# Patient Record
Sex: Female | Born: 1975 | Race: Black or African American | Hispanic: No | Marital: Single | Smoking: Never smoker
Health system: Southern US, Community
[De-identification: ages and names within clinical notes are randomized; demographics above are authoritative.]

## PROBLEM LIST (undated history)

## (undated) DIAGNOSIS — E049 Nontoxic goiter, unspecified: Secondary | ICD-10-CM

## (undated) DIAGNOSIS — R748 Abnormal levels of other serum enzymes: Secondary | ICD-10-CM

## (undated) DIAGNOSIS — M797 Fibromyalgia: Secondary | ICD-10-CM

## (undated) DIAGNOSIS — J0100 Acute maxillary sinusitis, unspecified: Secondary | ICD-10-CM

## (undated) DIAGNOSIS — D252 Subserosal leiomyoma of uterus: Secondary | ICD-10-CM

## (undated) DIAGNOSIS — K611 Rectal abscess: Secondary | ICD-10-CM

## (undated) DIAGNOSIS — K8020 Calculus of gallbladder without cholecystitis without obstruction: Secondary | ICD-10-CM

## (undated) DIAGNOSIS — M1990 Unspecified osteoarthritis, unspecified site: Secondary | ICD-10-CM

## (undated) DIAGNOSIS — Z862 Personal history of diseases of the blood and blood-forming organs and certain disorders involving the immune mechanism: Secondary | ICD-10-CM

## (undated) HISTORY — DX: Fibromyalgia: M79.7

## (undated) HISTORY — DX: Nontoxic goiter, unspecified: E04.9

## (undated) HISTORY — DX: Subserosal leiomyoma of uterus: D25.2

## (undated) HISTORY — PX: ECTOPIC PREGNANCY SURGERY: SHX613

## (undated) HISTORY — DX: Rectal abscess: K61.1

## (undated) HISTORY — PX: ABDOMINAL HYSTERECTOMY: SHX81

## (undated) HISTORY — DX: Personal history of diseases of the blood and blood-forming organs and certain disorders involving the immune mechanism: Z86.2

## (undated) HISTORY — DX: Abnormal levels of other serum enzymes: R74.8

## (undated) HISTORY — DX: Acute maxillary sinusitis, unspecified: J01.00

## (undated) HISTORY — DX: Unspecified osteoarthritis, unspecified site: M19.90

---

## 2002-05-06 ENCOUNTER — Inpatient Hospital Stay (HOSPITAL_COMMUNITY): Admission: AD | Admit: 2002-05-06 | Discharge: 2002-05-07 | Payer: Self-pay | Admitting: Obstetrics and Gynecology

## 2002-05-07 ENCOUNTER — Encounter (INDEPENDENT_AMBULATORY_CARE_PROVIDER_SITE_OTHER): Payer: Self-pay

## 2002-11-04 ENCOUNTER — Encounter: Admission: RE | Admit: 2002-11-04 | Discharge: 2002-11-04 | Payer: Self-pay | Admitting: Family Medicine

## 2002-11-06 ENCOUNTER — Encounter: Payer: Self-pay | Admitting: Sports Medicine

## 2002-11-06 ENCOUNTER — Encounter: Admission: RE | Admit: 2002-11-06 | Discharge: 2002-11-06 | Payer: Self-pay | Admitting: Sports Medicine

## 2002-11-16 ENCOUNTER — Encounter: Admission: RE | Admit: 2002-11-16 | Discharge: 2002-11-16 | Payer: Self-pay | Admitting: Sports Medicine

## 2004-01-27 ENCOUNTER — Ambulatory Visit: Payer: Self-pay | Admitting: Family Medicine

## 2004-02-16 ENCOUNTER — Ambulatory Visit: Payer: Self-pay | Admitting: Family Medicine

## 2004-06-23 ENCOUNTER — Ambulatory Visit: Payer: Self-pay | Admitting: Family Medicine

## 2004-06-27 ENCOUNTER — Ambulatory Visit: Payer: Self-pay | Admitting: Family Medicine

## 2004-10-11 ENCOUNTER — Encounter: Admission: RE | Admit: 2004-10-11 | Discharge: 2004-10-11 | Payer: Self-pay | Admitting: Sports Medicine

## 2005-10-31 ENCOUNTER — Ambulatory Visit: Payer: Self-pay | Admitting: Internal Medicine

## 2005-11-01 ENCOUNTER — Ambulatory Visit: Payer: Self-pay | Admitting: Internal Medicine

## 2006-02-08 ENCOUNTER — Ambulatory Visit: Payer: Self-pay | Admitting: Internal Medicine

## 2006-05-22 ENCOUNTER — Ambulatory Visit: Payer: Self-pay | Admitting: Internal Medicine

## 2006-05-22 LAB — CONVERTED CEMR LAB
ALT: 12 units/L (ref 0–40)
AST: 19 units/L (ref 0–37)
Bilirubin, Direct: 0.1 mg/dL (ref 0.0–0.3)
Eosinophils Absolute: 0.2 10*3/uL (ref 0.0–0.6)
Eosinophils Relative: 1.8 % (ref 0.0–5.0)
GFR calc Af Amer: 126 mL/min
GFR calc non Af Amer: 104 mL/min
Hemoglobin: 12.8 g/dL (ref 12.0–15.0)
LDL Cholesterol: 100 mg/dL — ABNORMAL HIGH (ref 0–99)
MCHC: 32.3 g/dL (ref 30.0–36.0)
Monocytes Absolute: 0.4 10*3/uL (ref 0.2–0.7)
Potassium: 4.2 meq/L (ref 3.5–5.1)
RBC: 5.49 M/uL — ABNORMAL HIGH (ref 3.87–5.11)
RDW: 14 % (ref 11.5–14.6)
Sodium: 141 meq/L (ref 135–145)
Total CHOL/HDL Ratio: 3.7
Triglycerides: 58 mg/dL (ref 0–149)
WBC: 11.1 10*3/uL — ABNORMAL HIGH (ref 4.5–10.5)

## 2006-05-23 DIAGNOSIS — K5900 Constipation, unspecified: Secondary | ICD-10-CM | POA: Insufficient documentation

## 2006-05-23 DIAGNOSIS — K921 Melena: Secondary | ICD-10-CM | POA: Insufficient documentation

## 2006-05-23 DIAGNOSIS — F339 Major depressive disorder, recurrent, unspecified: Secondary | ICD-10-CM | POA: Insufficient documentation

## 2006-05-23 DIAGNOSIS — IMO0001 Reserved for inherently not codable concepts without codable children: Secondary | ICD-10-CM | POA: Insufficient documentation

## 2006-05-23 DIAGNOSIS — M6740 Ganglion, unspecified site: Secondary | ICD-10-CM | POA: Insufficient documentation

## 2006-05-23 DIAGNOSIS — E049 Nontoxic goiter, unspecified: Secondary | ICD-10-CM | POA: Insufficient documentation

## 2006-06-25 ENCOUNTER — Ambulatory Visit: Payer: Self-pay | Admitting: Internal Medicine

## 2006-06-25 ENCOUNTER — Encounter: Payer: Self-pay | Admitting: Internal Medicine

## 2006-06-25 ENCOUNTER — Other Ambulatory Visit: Admission: RE | Admit: 2006-06-25 | Discharge: 2006-06-25 | Payer: Self-pay | Admitting: Internal Medicine

## 2006-08-26 ENCOUNTER — Ambulatory Visit: Payer: Self-pay | Admitting: Internal Medicine

## 2006-08-26 LAB — CONVERTED CEMR LAB
Free T4: 0.7 ng/dL (ref 0.6–1.6)
TSH: 0.52 microintl units/mL (ref 0.35–5.50)

## 2006-08-27 ENCOUNTER — Encounter: Payer: Self-pay | Admitting: Internal Medicine

## 2006-08-27 LAB — CONVERTED CEMR LAB
Thyroglobulin Ab: 48.5 (ref 0.0–60.0)
Thyroperoxidase Ab SerPl-aCnc: 46.5 (ref 0.0–60.0)

## 2006-09-23 ENCOUNTER — Ambulatory Visit: Payer: Self-pay | Admitting: Internal Medicine

## 2006-10-14 ENCOUNTER — Encounter: Admission: RE | Admit: 2006-10-14 | Discharge: 2006-10-14 | Payer: Self-pay | Admitting: Internal Medicine

## 2006-11-08 ENCOUNTER — Telehealth: Payer: Self-pay | Admitting: Internal Medicine

## 2006-11-11 ENCOUNTER — Encounter: Payer: Self-pay | Admitting: Internal Medicine

## 2006-11-15 ENCOUNTER — Ambulatory Visit: Payer: Self-pay | Admitting: Internal Medicine

## 2006-11-15 LAB — CONVERTED CEMR LAB
Basophils Relative: 0.6 % (ref 0.0–1.0)
Eosinophils Absolute: 0.2 10*3/uL (ref 0.0–0.6)
Eosinophils Relative: 1.6 % (ref 0.0–5.0)
Hemoglobin: 12.9 g/dL (ref 12.0–15.0)
INR: 1
Lymphocytes Relative: 23.8 % (ref 12.0–46.0)
MCV: 71.1 fL — ABNORMAL LOW (ref 78.0–100.0)
Monocytes Absolute: 0.8 10*3/uL — ABNORMAL HIGH (ref 0.2–0.7)
Neutro Abs: 8.3 10*3/uL — ABNORMAL HIGH (ref 1.4–7.7)
Neutrophils Relative %: 67.9 % (ref 43.0–77.0)

## 2006-11-18 ENCOUNTER — Encounter: Admission: RE | Admit: 2006-11-18 | Discharge: 2006-11-18 | Payer: Self-pay | Admitting: Internal Medicine

## 2006-11-19 ENCOUNTER — Telehealth: Payer: Self-pay | Admitting: Internal Medicine

## 2006-12-30 ENCOUNTER — Ambulatory Visit: Payer: Self-pay | Admitting: Family Medicine

## 2007-01-02 LAB — CONVERTED CEMR LAB
AST: 20 units/L (ref 0–37)
Albumin: 4.1 g/dL (ref 3.5–5.2)
Alkaline Phosphatase: 62 units/L (ref 39–117)
BUN: 10 mg/dL (ref 6–23)
Basophils Absolute: 0.1 10*3/uL (ref 0.0–0.1)
Chloride: 109 meq/L (ref 96–112)
Eosinophils Absolute: 0.2 10*3/uL (ref 0.0–0.6)
Free T4: 0.8 ng/dL (ref 0.6–1.6)
GFR calc non Af Amer: 103 mL/min
HCT: 36.3 % (ref 36.0–46.0)
MCHC: 32.5 g/dL (ref 30.0–36.0)
MCV: 71.7 fL — ABNORMAL LOW (ref 78.0–100.0)
Monocytes Relative: 6.5 % (ref 3.0–11.0)
Neutrophils Relative %: 70.8 % (ref 43.0–77.0)
Platelets: 324 10*3/uL (ref 150–400)
RBC: 5.06 M/uL (ref 3.87–5.11)
RDW: 13.3 % (ref 11.5–14.6)
Sodium: 142 meq/L (ref 135–145)
T3, Free: 2.8 pg/mL (ref 2.3–4.2)

## 2007-02-10 ENCOUNTER — Telehealth: Payer: Self-pay | Admitting: Internal Medicine

## 2007-07-28 ENCOUNTER — Other Ambulatory Visit: Admission: RE | Admit: 2007-07-28 | Discharge: 2007-07-28 | Payer: Self-pay | Admitting: Internal Medicine

## 2007-07-28 ENCOUNTER — Ambulatory Visit: Payer: Self-pay | Admitting: Internal Medicine

## 2007-07-28 ENCOUNTER — Encounter: Payer: Self-pay | Admitting: Internal Medicine

## 2007-07-28 DIAGNOSIS — L089 Local infection of the skin and subcutaneous tissue, unspecified: Secondary | ICD-10-CM | POA: Insufficient documentation

## 2007-07-28 LAB — CONVERTED CEMR LAB: Pap Smear: NORMAL

## 2007-07-30 LAB — CONVERTED CEMR LAB
Basophils Absolute: 0.1 10*3/uL (ref 0.0–0.1)
Bilirubin, Direct: 0.1 mg/dL (ref 0.0–0.3)
Calcium: 9.6 mg/dL (ref 8.4–10.5)
Cholesterol: 192 mg/dL (ref 0–200)
Eosinophils Absolute: 0.2 10*3/uL (ref 0.0–0.7)
GFR calc Af Amer: 124 mL/min
GFR calc non Af Amer: 102 mL/min
HCT: 40.8 % (ref 36.0–46.0)
HDL: 38.3 mg/dL — ABNORMAL LOW (ref >39.0)
Hemoglobin: 13.4 g/dL (ref 12.0–15.0)
LDL Cholesterol: 143 mg/dL — ABNORMAL HIGH (ref 0–99)
MCHC: 32.9 g/dL (ref 30.0–36.0)
Monocytes Absolute: 0.6 10*3/uL (ref 0.1–1.0)
Neutro Abs: 8 10*3/uL — ABNORMAL HIGH (ref 1.4–7.7)
RDW: 13.4 % (ref 11.5–14.6)
Sodium: 139 meq/L (ref 135–145)
TSH: 0.97 microintl units/mL (ref 0.35–5.50)
Total Bilirubin: 0.8 mg/dL (ref 0.3–1.2)
Total Protein: 7.9 g/dL (ref 6.0–8.3)
Triglycerides: 52 mg/dL (ref 0–149)

## 2007-08-05 ENCOUNTER — Telehealth: Payer: Self-pay | Admitting: Internal Medicine

## 2007-08-12 ENCOUNTER — Telehealth: Payer: Self-pay | Admitting: Internal Medicine

## 2007-10-23 ENCOUNTER — Ambulatory Visit: Payer: Self-pay | Admitting: Internal Medicine

## 2007-10-23 LAB — CONVERTED CEMR LAB: Vit D, 1,25-Dihydroxy: 23 — ABNORMAL LOW (ref 30–89)

## 2007-10-30 ENCOUNTER — Telehealth: Payer: Self-pay | Admitting: Internal Medicine

## 2007-11-20 ENCOUNTER — Ambulatory Visit: Payer: Self-pay | Admitting: Internal Medicine

## 2007-11-20 DIAGNOSIS — E559 Vitamin D deficiency, unspecified: Secondary | ICD-10-CM | POA: Insufficient documentation

## 2007-11-20 DIAGNOSIS — F4321 Adjustment disorder with depressed mood: Secondary | ICD-10-CM

## 2008-01-23 ENCOUNTER — Ambulatory Visit: Payer: Self-pay | Admitting: Internal Medicine

## 2008-01-23 DIAGNOSIS — D649 Anemia, unspecified: Secondary | ICD-10-CM | POA: Insufficient documentation

## 2008-01-23 DIAGNOSIS — T50995A Adverse effect of other drugs, medicaments and biological substances, initial encounter: Secondary | ICD-10-CM | POA: Insufficient documentation

## 2008-01-23 LAB — CONVERTED CEMR LAB
Eosinophils Relative: 1.1 % (ref 0.0–5.0)
Ferritin: 66 ng/mL (ref 10–291)
Free T4: 0.8 ng/dL (ref 0.6–1.6)
HCT: 38.9 % (ref 36.0–46.0)
Lymphocytes Relative: 19.1 % (ref 12.0–46.0)
Monocytes Absolute: 0.2 10*3/uL (ref 0.1–1.0)
Monocytes Relative: 2.2 % — ABNORMAL LOW (ref 3.0–12.0)
Neutrophils Relative %: 77.4 % — ABNORMAL HIGH (ref 43.0–77.0)
Platelets: 314 10*3/uL (ref 150–400)
TSH: 0.54 microintl units/mL (ref 0.35–5.50)
WBC: 10.7 10*3/uL — ABNORMAL HIGH (ref 4.5–10.5)

## 2008-02-03 LAB — CONVERTED CEMR LAB
Eosinophils Absolute: 0.1 10*3/uL (ref 0.0–0.7)
Eosinophils Relative: 1.1 % (ref 0.0–5.0)
MCV: 72.5 fL — ABNORMAL LOW (ref 78.0–100.0)
Neutrophils Relative %: 77.4 % — ABNORMAL HIGH (ref 43.0–77.0)
Platelets: 314 10*3/uL (ref 150–400)
TSH: 0.54 microintl units/mL (ref 0.35–5.50)
WBC: 10.7 10*3/uL — ABNORMAL HIGH (ref 4.5–10.5)

## 2008-02-23 ENCOUNTER — Telehealth: Payer: Self-pay | Admitting: Internal Medicine

## 2008-03-15 ENCOUNTER — Telehealth: Payer: Self-pay | Admitting: Internal Medicine

## 2008-03-26 DIAGNOSIS — R748 Abnormal levels of other serum enzymes: Secondary | ICD-10-CM

## 2008-03-26 HISTORY — DX: Abnormal levels of other serum enzymes: R74.8

## 2008-04-12 ENCOUNTER — Telehealth: Payer: Self-pay | Admitting: Internal Medicine

## 2008-11-08 ENCOUNTER — Telehealth: Payer: Self-pay | Admitting: *Deleted

## 2008-11-11 ENCOUNTER — Telehealth: Payer: Self-pay | Admitting: *Deleted

## 2008-11-15 ENCOUNTER — Ambulatory Visit: Payer: Self-pay | Admitting: Internal Medicine

## 2008-11-15 DIAGNOSIS — I10 Essential (primary) hypertension: Secondary | ICD-10-CM | POA: Insufficient documentation

## 2008-11-15 LAB — CONVERTED CEMR LAB
Bilirubin Urine: NEGATIVE
Glucose, Urine, Semiquant: NEGATIVE
Ketones, urine, test strip: NEGATIVE
Urobilinogen, UA: 0.2
pH: 5.5

## 2008-11-16 ENCOUNTER — Encounter: Payer: Self-pay | Admitting: Internal Medicine

## 2008-11-16 LAB — CONVERTED CEMR LAB: Thyroperoxidase Ab SerPl-aCnc: 26 (ref 0.0–60.0)

## 2008-11-17 LAB — CONVERTED CEMR LAB
AST: 45 units/L — ABNORMAL HIGH (ref 0–37)
Albumin: 4.8 g/dL (ref 3.5–5.2)
Alkaline Phosphatase: 74 units/L (ref 39–117)
Basophils Absolute: 0 10*3/uL (ref 0.0–0.1)
Bilirubin, Direct: 0.1 mg/dL (ref 0.0–0.3)
CRP, High Sensitivity: 8 — ABNORMAL HIGH (ref 0.00–5.00)
Calcium: 9.8 mg/dL (ref 8.4–10.5)
Eosinophils Relative: 1.7 % (ref 0.0–5.0)
Folate: 6.4 ng/mL
Free T4: 0.7 ng/dL (ref 0.6–1.6)
GFR calc non Af Amer: 105.37 mL/min (ref >60)
Glucose, Bld: 70 mg/dL (ref 70–99)
HDL: 43.4 mg/dL (ref >39.00)
Lymphocytes Relative: 17.4 % (ref 12.0–46.0)
Lymphs Abs: 1.9 10*3/uL (ref 0.7–4.0)
Monocytes Relative: 2.2 % — ABNORMAL LOW (ref 3.0–12.0)
Neutrophils Relative %: 78.3 % — ABNORMAL HIGH (ref 43.0–77.0)
Platelets: 344 10*3/uL (ref 150.0–400.0)
Potassium: 3.6 meq/L (ref 3.5–5.1)
RDW: 13.5 % (ref 11.5–14.6)
Rhuematoid fact SerPl-aCnc: 20 intl units/mL (ref 0.0–20.0)
Sodium: 146 meq/L — ABNORMAL HIGH (ref 135–145)
TSH: 0.98 microintl units/mL (ref 0.35–5.50)
Total Bilirubin: 0.8 mg/dL (ref 0.3–1.2)
Total CHOL/HDL Ratio: 5
VLDL: 18.2 mg/dL (ref 0.0–40.0)
Vitamin B-12: 341 pg/mL (ref 211–911)
WBC: 11.2 10*3/uL — ABNORMAL HIGH (ref 4.5–10.5)

## 2008-11-18 ENCOUNTER — Encounter: Payer: Self-pay | Admitting: Internal Medicine

## 2008-11-19 ENCOUNTER — Telehealth: Payer: Self-pay | Admitting: *Deleted

## 2008-11-22 LAB — CONVERTED CEMR LAB: Aldolase: 12.2 units/L — ABNORMAL HIGH (ref <=8.1)

## 2008-11-24 ENCOUNTER — Encounter (INDEPENDENT_AMBULATORY_CARE_PROVIDER_SITE_OTHER): Payer: Self-pay | Admitting: *Deleted

## 2008-12-07 ENCOUNTER — Telehealth: Payer: Self-pay | Admitting: Internal Medicine

## 2008-12-09 ENCOUNTER — Telehealth: Payer: Self-pay | Admitting: *Deleted

## 2008-12-09 ENCOUNTER — Encounter: Payer: Self-pay | Admitting: Internal Medicine

## 2008-12-27 ENCOUNTER — Telehealth: Payer: Self-pay | Admitting: *Deleted

## 2008-12-29 ENCOUNTER — Ambulatory Visit: Payer: Self-pay | Admitting: Internal Medicine

## 2008-12-29 LAB — CONVERTED CEMR LAB
BUN: 6 mg/dL (ref 6–23)
Basophils Absolute: 0 10*3/uL (ref 0.0–0.1)
Bilirubin Urine: NEGATIVE
Bilirubin, Direct: 0.1 mg/dL (ref 0.0–0.3)
Blood in Urine, dipstick: NEGATIVE
CO2: 23 meq/L (ref 19–32)
Calcium: 9.2 mg/dL (ref 8.4–10.5)
Chloride: 105 meq/L (ref 96–112)
Cholesterol: 160 mg/dL (ref 0–200)
Creatinine, Ser: 0.7 mg/dL (ref 0.4–1.2)
Eosinophils Absolute: 0.1 10*3/uL (ref 0.0–0.7)
HDL: 35.3 mg/dL — ABNORMAL LOW (ref >39.00)
Ketones, urine, test strip: NEGATIVE
Lymphocytes Relative: 15.2 % (ref 12.0–46.0)
MCHC: 32.2 g/dL (ref 30.0–36.0)
MCV: 72.5 fL — ABNORMAL LOW (ref 78.0–100.0)
Monocytes Absolute: 0.3 10*3/uL (ref 0.1–1.0)
Neutrophils Relative %: 80 % — ABNORMAL HIGH (ref 43.0–77.0)
Nitrite: NEGATIVE
Platelets: 261 10*3/uL (ref 150.0–400.0)
RDW: 14.3 % (ref 11.5–14.6)
Specific Gravity, Urine: 1.02
Total Bilirubin: 0.7 mg/dL (ref 0.3–1.2)
Total Protein: 7.2 g/dL (ref 6.0–8.3)
Triglycerides: 59 mg/dL (ref 0.0–149.0)
Urobilinogen, UA: 0.2

## 2009-01-05 ENCOUNTER — Ambulatory Visit: Payer: Self-pay | Admitting: Internal Medicine

## 2009-05-06 ENCOUNTER — Encounter: Payer: Self-pay | Admitting: Internal Medicine

## 2009-09-16 ENCOUNTER — Ambulatory Visit (HOSPITAL_COMMUNITY): Admission: RE | Admit: 2009-09-16 | Discharge: 2009-09-16 | Payer: Self-pay | Admitting: Obstetrics & Gynecology

## 2010-04-25 NOTE — Letter (Signed)
Summary: Rheumatology-Dr. Charlestine Night  Rheumatology-Dr. Charlestine Night   Imported By: Laural Benes 05/18/2009 15:17:56  _____________________________________________________________________  External Attachment:    Type:   Image     Comment:   External Document

## 2010-06-11 LAB — CBC
MCHC: 33 g/dL (ref 30.0–36.0)
RDW: 14.3 % (ref 11.5–15.5)
WBC: 12.4 10*3/uL — ABNORMAL HIGH (ref 4.0–10.5)

## 2010-06-11 LAB — ABO/RH: ABO/RH(D): O POS

## 2010-08-11 NOTE — Discharge Summary (Signed)
   [name], [name]                           ACCOUNT NO.:  [id number]   MEDICAL RECORD NO.:  [id number]                   PATIENT TYPE:  INP   LOCATION:  9305                                 FACILITY:  [facility]   PHYSICIAN:  Alver Sorrow. Charlies Constable, M.D.              DATE OF BIRTH:  02/06/1976   DATE OF ADMISSION:  05/06/2002  DATE OF DISCHARGE:  05/07/2002                                 DISCHARGE SUMMARY   DISCHARGE DIAGNOSES:  1. Right ectopic pregnancy.  2. Hemoperitoneum.  3. Status post laparoscopic right salpingectomy and evacuation of     hemoperitoneum by Dr. Elveria Rising on May 07, 2002.   HISTORY:  This is a 26-years-of-age female gravida 2 para 1 who presented to  the [facility] after leaving [facility] against  medical advice thus being told she had a living ectopic and they needed to  do surgery to treat.  The patient had said she was not comfortable there so  she left.  The patient reported discomfort but no significant pain, denied  bleeding; hCG quantitative was 31,695.  Ultrasound report revealed ectopic  gestation with an eight-week fetus adjacent to the right ovary.  Cardiac  activity 155.  The patient therefore was admitted.   HOSPITAL COURSE:  On May 07, 2002 the patient underwent for a right  ectopic pregnancy a laparoscopic right salpingectomy and evacuation of  hemoperitoneum.  There was found to be a mild hemoperitoneum filling the  pelvis and part of the upper abdomen.  Right ectopic pregnancy with partial  rupture in the mid portion of the tube.  Postoperatively the patient did  remain afebrile, in stable condition, and voiding, and she was discharged to  home on the afternoon of May 07, 2002 in satisfactory condition and  given [facility] Gynecology instructions.   ACCESSORY CLINICAL FINDINGS/LABORATORY DATA:  The patient is O positive.  On  May 06, 2002 hemoglobin was 12.5.   DISPOSITION:  1. The patient was  discharged to home.  2. She was given prescription for Tylox p.r.n. pain.  3. She is to follow up in the office in one week.  If she had any problems     prior to that time to be seen prior to then.     Arne Cleveland, P.A.                    Alver Sorrow. Charlies Constable, M.D.    TSG/MEDQ  D:  06/12/2002  T:  06/12/2002  Job:  [id number]
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
## 2010-08-11 NOTE — Op Note (Signed)
NAMEMELAYA, [name]                           ACCOUNT NO.:  [id number]   MEDICAL RECORD NO.:  [id number]                   PATIENT TYPE:  AMB   LOCATION:  [facility]                                 FACILITY:  [facility]   PHYSICIAN:  Alver Sorrow. Charlies Constable, M.D.              DATE OF BIRTH:  11-24-1975   DATE OF PROCEDURE:  05/07/2002  DATE OF DISCHARGE:                                 OPERATIVE REPORT   PREOPERATIVE DIAGNOSIS:  Right ectopic pregnancy.   POSTOPERATIVE DIAGNOSES:  1. Right ectopic pregnancy.  2. Hemoperitoneum.   PROCEDURE:  Laparoscopic right salpingectomy and evacuation of  hemoperitoneum.   SURGEON:  Alver Sorrow. Charlies Constable, M.D.   ANESTHESIA:  General.   ESTIMATED BLOOD LOSS:  (including hemoperitoneum) approximately 400 cc.   URINE OUTPUT:  100 cc of clear urine.   FINDINGS:  There is a mild hemoperitoneum filling the pelvis and part of the  upper abdomen.  There was a right ectopic pregnancy that was partially  ruptured in the mid portion of the tube.  Normal fimbria bilaterally.  There  was filmy scar tissue seen on anterior abdominal wall, secondary to cesarean  section.   PATHOLOGY:  Mid portion of the right tube with ectopic.   COMPLICATIONS:  None.   DESCRIPTION OF PROCEDURE:  The patient was taken to the operating room.  General anesthesia was induced with the patient in the dorsal lithotomy  position.  She was prepped and draped in usual sterile fashion.  A bivalved  speculum was placed in the vagina and the cervix was visualized.  Stabilized  with a uterine manipulator.  Gloves were changed and then attention was  turned to the abdomen.   An infraumbilical incision was made with the scalpel.  The Veress needle was  inserted.  Opening pressure was -2.  Free flow of saline.  A  pneumoperitoneum was then created until tenting was appreciated above the  liver, after which a #10 disposable trocar was inserted through the  infraumbilical port.  Immediately upon  placement the hemoperitoneum was  appreciated.  The pelvic organs were not able to be visualized and normal  landmarks were obscured.  The patient was then placed in deep Trendelenburg  position, and large percentage of the nonclotted blood was able to be moved  to the upper abdomen; however, there was a large clot as well.   Then, under direct visualization, a #5 port was placed in the midline  suprapubically.  Through this a suction irrigator was placed.  Some of the  residual in the pelvis was then removed.  We were able to visualize the  ectopic; it had a slow, continuous bleed.   A #10-11 port was placed underneath that and visualization  in the right  lower quadrant.  The tube was elevated and two free ties of 0 Vicryl were  placed.  Upon pinching, the bleeding from the tube immediately  stopped.  Inspection showed that the two ties were not as close together as we would  like, and a third tie was therefore placed.  After the third tie the mid  portion of the tube was sharply dissected off, leaving the two ties.  The  patient's third tie was of PDS.   The specimen was then placed in the anterior cul-de-sac.  The patient was  placed in reverse Trendelenburg.  Some more of the irrigant and nonclotted  blood was evacuated.  A large-bore suction curet was then advanced into the  pelvis.  Elevated clot was too large to safely evacuate.  A large amount of  it was placed behind the uterus.   The Endobag was then advanced through the #10 port.  The ectopic pregnancy  was placed in the bag and delivered to the skin.  However, it was evident  that the ectopic was much larger than the port.  The fascia was then  extended medially until the ectopic could safely be delivered.   The fascia was then closed in a running, locked layer with 0 Vicryl.  The  pneumoperitoneum was then recreated.  The port site was inspected and noted  to be hemostatic.  Again, the pelvis was irrigated with copious  amounts of  warm saline.  Hemostasis of the tubal portion was also appreciated.  The  other ports were removed under direct visualization.  Pneumoperitoneum was  also released.  The infraumbilical port was closed with a figure-of-eight of  0 Vicryl.  The skin was reapproximated with interrupted 3-0 plain.  The skin  on the lower ports was reapproximated with Steri-Strips and tincture of  Benzoin.  The three ports were injected with a total of 10 cc of 0.25%  Marcaine solution.  The instruments were then removed from the vagina.  The  cervix was noted to be hemostatic.  The patient was extubated and  transferred to the PACU in stable condition.                                               Alver Sorrow. Charlies Constable, M.D.    THL/MEDQ  D:  05/07/2002  T:  05/07/2002  Job:  [id number]

## 2010-10-12 ENCOUNTER — Other Ambulatory Visit: Payer: Self-pay | Admitting: Internal Medicine

## 2010-10-16 ENCOUNTER — Other Ambulatory Visit: Payer: Self-pay | Admitting: Internal Medicine

## 2010-11-20 ENCOUNTER — Encounter: Payer: Self-pay | Admitting: Internal Medicine

## 2010-11-20 DIAGNOSIS — E049 Nontoxic goiter, unspecified: Secondary | ICD-10-CM | POA: Insufficient documentation

## 2010-11-21 ENCOUNTER — Encounter: Payer: Self-pay | Admitting: Internal Medicine

## 2010-11-21 ENCOUNTER — Ambulatory Visit (INDEPENDENT_AMBULATORY_CARE_PROVIDER_SITE_OTHER): Payer: PRIVATE HEALTH INSURANCE | Admitting: Internal Medicine

## 2010-11-21 VITALS — BP 140/90 | HR 78 | Wt 184.0 lb

## 2010-11-21 DIAGNOSIS — E049 Nontoxic goiter, unspecified: Secondary | ICD-10-CM

## 2010-11-21 DIAGNOSIS — R229 Localized swelling, mass and lump, unspecified: Secondary | ICD-10-CM

## 2010-11-21 DIAGNOSIS — M79609 Pain in unspecified limb: Secondary | ICD-10-CM

## 2010-11-21 DIAGNOSIS — M79643 Pain in unspecified hand: Secondary | ICD-10-CM | POA: Insufficient documentation

## 2010-11-21 DIAGNOSIS — I10 Essential (primary) hypertension: Secondary | ICD-10-CM

## 2010-11-21 DIAGNOSIS — Z299 Encounter for prophylactic measures, unspecified: Secondary | ICD-10-CM | POA: Insufficient documentation

## 2010-11-21 MED ORDER — ALPRAZOLAM 0.5 MG PO TABS: 0.5000 mg | ORAL_TABLET | Freq: Every evening | ORAL | Status: DC | PRN

## 2010-11-21 MED ORDER — IBUPROFEN 800 MG PO TABS: 800.0000 mg | ORAL_TABLET | Freq: Three times a day (TID) | ORAL | Status: DC | PRN

## 2010-11-21 NOTE — Patient Instructions (Addendum)
Will  Get a ortho hand consults bout your hand pain and nodules.  Also get  cpx labs done earlier .    Aleve 1-2 2 x per day for pain in the meantime or ibuprofen   As directed   Check Blood pressure readings in the meantime .  And if consistently elevated 140/90 and over call . Otherwise will address at your check up appt in October.

## 2010-11-21 NOTE — Progress Notes (Signed)
  Subjective:    Patient ID: [name], female    DOB: Jan 06, 1976, 35 y.o.   MRN: [id number]  HPI  At least 2 months of sx  Insidious onset and progressing pain is so bad sometimes that it hurts u to her teeth  Feels like nerve pain.   Now beginning to  "Drop stuff" cause of pain; using ibuprofen.   Few pain pills.from her mom to help.  Past hx saw Dr Charlestine Night  For elevated cpk but he said fibromyalgia and not myositis.   No recent arthritis dx.  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  Review of Systems Neg fever cp sob syncope sig GI sx . Cough sob.   Past history family history social history reviewed in the electronic medical record.     Objective:   Physical Exam WDWN in nad  Neck goiter no tenderness  CV pulses intact No clubbing cyanosis or edema  Extremities. : hands cool bilaterally but nl radial pulse . No color change Small firm nodule dorsal wrist  And alos on flexor surfaces thumb and 2 fingers . Grip ok and no waisting.    Neuro DTRs present. Oriented x 3 and no noted deficits in memory, attention, and speech.      Assessment & Plan:  Hand pain bilateral right more than left    persistent / progressive  Multiple nodules . ?Ganglion   Multiple  Makes it hard to do her job . ? If could be a verson of cts but does have hands quite cool to touch   But nl pulses .  Not on med currently that could do this and no classic raynauds.   Get hand consult ? Overuse. Ok to use ibu for pain and xanax at night to see if helps her sleep currently.  Bp  Monitor . Ran out of med . If needed may use different med.  Obtain Lab work this week  And keep cpx appt.

## 2010-11-23 ENCOUNTER — Other Ambulatory Visit: Payer: Self-pay

## 2010-11-29 ENCOUNTER — Telehealth: Payer: Self-pay | Admitting: *Deleted

## 2010-11-29 DIAGNOSIS — M79643 Pain in unspecified hand: Secondary | ICD-10-CM

## 2010-11-29 DIAGNOSIS — R229 Localized swelling, mass and lump, unspecified: Secondary | ICD-10-CM

## 2010-11-29 NOTE — Telephone Encounter (Signed)
Need note from Dr. Caralyn Guile.   Derm at  [facility] is very good  We can refer if  Not already done and then we can get another rheum  to see her.

## 2010-11-29 NOTE — Telephone Encounter (Signed)
Pt saw Dr. Caralyn Guile and he said that there was nothing he could do. Pt saw him on 9/4.  He dx her with a nerve condition. He wanted to send her to a derm at [facility]. Pt is having severe pain and knots have gotten bigger in her hands. He pain is gotten worse. Pt is worried that she is not going to be able to work much longer. Pt is taking Motrin 810m and then tylenol 5083malternating every 2 hours. This is not helping. Dr. OrCaralyn Guilehinks it's more of a derm problem because the nodules are under skin. Pt states that Dr. TrCharlestine Nightas not helped her with her hands. So if you want to refer her to another rhem that would be fine as well.

## 2010-11-29 NOTE — Telephone Encounter (Signed)
Per Dr. Regis Bill- need to get Dr. Angus Palms note and we can refer to either Derm or another Rheum. Pt wants [place] to refer her another Rheum. Pt is going to see [place] in [place].

## 2010-12-01 ENCOUNTER — Telehealth: Payer: Self-pay | Admitting: *Deleted

## 2010-12-01 MED ORDER — PREDNISONE 10 MG PO TABS: ORAL_TABLET | ORAL | Status: DC

## 2010-12-01 MED ORDER — HYDROCODONE-ACETAMINOPHEN 5-500 MG PO TABS: 1.0000 | ORAL_TABLET | Freq: Four times a day (QID) | ORAL | Status: DC | PRN

## 2010-12-01 NOTE — Telephone Encounter (Signed)
Addended by: Sherron Monday on: 12/01/2010 03:55 PM   Modules accepted: Orders

## 2010-12-01 NOTE — Telephone Encounter (Signed)
Rx sent to pharmacy   

## 2010-12-01 NOTE — Telephone Encounter (Signed)
Pt is using vicodin 5/500 that she got from her mother. Pt doesn't want to do the work note for now because she needs to work. She would like a rx for vicodin and I told pt that I would call her as soon as I can talk to [facility] about an appt for her. I explained to her that Dr. Regis Bill doesn't think a MRI would show up anything but we need to get someone to see what is going on.   Per Dr. Regis Bill Vicodin 5/500 #20 1 every 6 hours as needed for severe pain.

## 2010-12-01 NOTE — Telephone Encounter (Signed)
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 But with this they normally see pus with it. Pt still having the knots in her hands where they took the biopsy. Pt was in so much pain that she didn't go to work today. Pt wants the MRI of her hands.   Per Dr. Regis Bill- agree that we need to push this forward but we need the notes from [facility] and Derm before she can make a decision.   I tried to call [facility]. At [phone]. But their office is closed for Friday.

## 2010-12-01 NOTE — Telephone Encounter (Signed)
I do not have enough information  For ordering an MRI .   At this point in time . We can use pain med over the weekend and trial of prednisone as an antiinflammatory.  To see if more comfortable .  I still need to see the consult notes.   She declined time off of work . Will work on getting her seen next week ( other offices closed on Friday afternoon)

## 2010-12-01 NOTE — Telephone Encounter (Signed)
Pt is asking for a MRI to be scheduled ASAP as she has seen 2 other MDs and does not agree with their diagnosis.  Advised appt with Dr. Regis Bill, but pt declined.

## 2010-12-04 ENCOUNTER — Telehealth: Payer: Self-pay | Admitting: *Deleted

## 2010-12-04 NOTE — Telephone Encounter (Signed)
I called [facility] and had to leave a message about getting pt worked in. I also spoke with pt about this and advised her to call them as well to see if she can get any where. Pt states that the prednisone and vicodin did help some. Pt needs a work note for Friday and today.

## 2010-12-04 NOTE — Telephone Encounter (Signed)
Per Dr. Regis Bill- pt was out for a medical condition and is under evaluation

## 2010-12-05 NOTE — Telephone Encounter (Signed)
Left message on machine that note is ready to pick up.

## 2010-12-11 ENCOUNTER — Other Ambulatory Visit: Payer: Self-pay | Admitting: Internal Medicine

## 2010-12-11 NOTE — Telephone Encounter (Signed)
[facility]

## 2010-12-12 MED ORDER — IBUPROFEN 800 MG PO TABS: 800.0000 mg | ORAL_TABLET | Freq: Three times a day (TID) | ORAL | Status: DC | PRN

## 2010-12-12 MED ORDER — ALPRAZOLAM 0.5 MG PO TABS: 0.5000 mg | ORAL_TABLET | Freq: Every evening | ORAL | Status: DC | PRN

## 2010-12-12 NOTE — Telephone Encounter (Signed)
Ok x 1 each

## 2010-12-12 NOTE — Telephone Encounter (Signed)
LOV 11-21-10 NOV 01-10-11

## 2010-12-12 NOTE — Telephone Encounter (Signed)
rx called in

## 2010-12-14 ENCOUNTER — Telehealth: Payer: Self-pay | Admitting: *Deleted

## 2010-12-14 NOTE — Telephone Encounter (Signed)
Spoke to pt- she has gotten more of the nodules on the sides of her hands. They are going to send her to [facility] for evaluation. They couldn't do anything for the pain. They are going to fax [place] notes. Pt needs something for pain. Prednisone did help. She saw the rheum and they did the ultrasound of the front of her hands and wrist. But did see fluid in her joints. But they didn't say anything else. I advise pt to call rheum and see what they say as well.

## 2010-12-14 NOTE — Telephone Encounter (Signed)
Pt is calling to speak to [facility] re: refilling her Prednisone and the Dermatologist she saw wants to refer her  To [facility], but wants pt to discuss the options with Dr. Regis Bill first.  Please call her re:  Next steps to take.

## 2010-12-15 ENCOUNTER — Telehealth: Payer: Self-pay | Admitting: *Deleted

## 2010-12-15 DIAGNOSIS — IMO0001 Reserved for inherently not codable concepts without codable children: Secondary | ICD-10-CM

## 2010-12-15 NOTE — Telephone Encounter (Signed)
There were orders for lab from  August that I guess never got done.  If Any  the specialists didn't get these done would do this with also    ANA,   Uric acid.  Ana  crp .( thy may already be in system)

## 2010-12-15 NOTE — Telephone Encounter (Signed)
Pt called stating that she spoke with her MD in [place] and they told her that she had Hep A in college. Pt states that they told her that she needs to have labs done since she had the predinsone. Pt is having some stomach pain and RLQ pain as well. This started within the last week. The knots are still painful.

## 2010-12-19 NOTE — Telephone Encounter (Signed)
Pt aware and appt scheduled.  

## 2010-12-21 ENCOUNTER — Other Ambulatory Visit: Payer: PRIVATE HEALTH INSURANCE

## 2010-12-21 ENCOUNTER — Other Ambulatory Visit (INDEPENDENT_AMBULATORY_CARE_PROVIDER_SITE_OTHER): Payer: PRIVATE HEALTH INSURANCE

## 2010-12-21 DIAGNOSIS — M79643 Pain in unspecified hand: Secondary | ICD-10-CM

## 2010-12-21 DIAGNOSIS — Z299 Encounter for prophylactic measures, unspecified: Secondary | ICD-10-CM

## 2010-12-21 DIAGNOSIS — E049 Nontoxic goiter, unspecified: Secondary | ICD-10-CM

## 2010-12-21 DIAGNOSIS — M79609 Pain in unspecified limb: Secondary | ICD-10-CM

## 2010-12-21 DIAGNOSIS — IMO0001 Reserved for inherently not codable concepts without codable children: Secondary | ICD-10-CM

## 2010-12-21 LAB — CBC WITH DIFFERENTIAL/PLATELET
Eosinophils Relative: 1.4 % (ref 0.0–5.0)
Lymphocytes Relative: 14 % (ref 12.0–46.0)
MCV: 73.3 fl — ABNORMAL LOW (ref 78.0–100.0)
Monocytes Absolute: 0.5 10*3/uL (ref 0.1–1.0)
Monocytes Relative: 4.8 % (ref 3.0–12.0)
Neutrophils Relative %: 79.5 % — ABNORMAL HIGH (ref 43.0–77.0)
Platelets: 293 10*3/uL (ref 150.0–400.0)
WBC: 9.7 10*3/uL (ref 4.5–10.5)

## 2010-12-22 LAB — BASIC METABOLIC PANEL
Calcium: 8.8 mg/dL (ref 8.4–10.5)
Creatinine, Ser: 0.6 mg/dL (ref 0.4–1.2)
GFR: 147.95 mL/min (ref >60.00)

## 2010-12-22 LAB — HEPATIC FUNCTION PANEL
Bilirubin, Direct: 0.1 mg/dL (ref 0.0–0.3)
Total Bilirubin: 0.4 mg/dL (ref 0.3–1.2)

## 2010-12-22 LAB — LIPID PANEL
Cholesterol: 203 mg/dL — ABNORMAL HIGH (ref 0–200)
HDL: 56.8 mg/dL (ref >39.00)
Total CHOL/HDL Ratio: 4
Triglycerides: 38 mg/dL (ref 0.0–149.0)
VLDL: 7.6 mg/dL (ref 0.0–40.0)

## 2010-12-22 LAB — T4, FREE: Free T4: 0.8 ng/dL (ref 0.60–1.60)

## 2010-12-22 LAB — ANA: Anti Nuclear Antibody(ANA): NEGATIVE

## 2010-12-22 LAB — LDL CHOLESTEROL, DIRECT: Direct LDL: 139.2 mg/dL

## 2010-12-22 LAB — TSH: TSH: 0.55 u[IU]/mL (ref 0.35–5.50)

## 2010-12-26 ENCOUNTER — Telehealth: Payer: Self-pay | Admitting: Internal Medicine

## 2010-12-26 NOTE — Telephone Encounter (Signed)
Left message for pt to call back  °

## 2010-12-26 NOTE — Telephone Encounter (Signed)
Pt called again

## 2010-12-26 NOTE — Telephone Encounter (Signed)
Still having severe hand pain and has been out of work for 2 days. Would like for Larene [facility] to call her with lab results.

## 2010-12-26 NOTE — Telephone Encounter (Signed)
Pt aware of her lab results. Pt is going to see the specialist at the end of Nov.

## 2010-12-27 ENCOUNTER — Telehealth: Payer: Self-pay | Admitting: Internal Medicine

## 2010-12-27 NOTE — Telephone Encounter (Signed)
Pt is still having the pain in hands. Her specialist is recommending her to go the neurologist. She doesn't want this appt. She just wants to feel better. [facility] can't see her until Nov 7th. As I was speaking to pt, she started saying that she would be better in a casket and that she wanted to take a whole bunch of pills. She was hysterical about her whole situation. She couldn't promise me that she wouldn't take the pills, so Derinda Late had someone call 911. We also talked to her manager and she was with her as well.

## 2010-12-27 NOTE — Telephone Encounter (Signed)
Returning [name]'s call.

## 2010-12-27 NOTE — Telephone Encounter (Signed)
Called and spoke with pt's office manager and advised her that EMS had been dispatched to their location due to pt having suicidal thoughts.  Estill Bamberg brought pt in to the office with her and remained with her until EMS arrived.

## 2010-12-27 NOTE — Telephone Encounter (Signed)
Left message to call back  

## 2010-12-28 NOTE — Telephone Encounter (Signed)
Pt aware of this.

## 2010-12-28 NOTE — Telephone Encounter (Signed)
Spoke to pt and was took to [facility] and evaluated there. She is feeling better. They said that she was feeling depressed about things and that she needs to write things down. Plus she needs to go to her appts. She agreed and she also sounded much better today. She states that they didn't keep her and that she is not in danger of hurting herself. She also said that she would need a note to go back to work. I advised her to call [facility] and see if they could do that but if not, I would see if Dr.Fry could do this for her. She took today off just to rest. Her pain level has gone back down compared to yesterday.

## 2010-12-28 NOTE — Telephone Encounter (Signed)
I am not willing to sign such a note since I have never met this person and am not involved in her care whatsoever. This needs to be written by the facility that saw her yesterday.

## 2010-12-29 ENCOUNTER — Other Ambulatory Visit: Payer: Self-pay

## 2011-01-10 ENCOUNTER — Encounter: Payer: Self-pay | Admitting: Internal Medicine

## 2011-01-10 ENCOUNTER — Ambulatory Visit (INDEPENDENT_AMBULATORY_CARE_PROVIDER_SITE_OTHER): Payer: PRIVATE HEALTH INSURANCE | Admitting: Internal Medicine

## 2011-01-10 VITALS — BP 120/80 | HR 60 | Ht 64.75 in | Wt 186.0 lb

## 2011-01-10 DIAGNOSIS — R229 Localized swelling, mass and lump, unspecified: Secondary | ICD-10-CM

## 2011-01-10 DIAGNOSIS — Z0000 Encounter for general adult medical examination without abnormal findings: Secondary | ICD-10-CM | POA: Insufficient documentation

## 2011-01-10 DIAGNOSIS — R21 Rash and other nonspecific skin eruption: Secondary | ICD-10-CM

## 2011-01-10 DIAGNOSIS — E049 Nontoxic goiter, unspecified: Secondary | ICD-10-CM

## 2011-01-10 DIAGNOSIS — I10 Essential (primary) hypertension: Secondary | ICD-10-CM

## 2011-01-10 DIAGNOSIS — M79643 Pain in unspecified hand: Secondary | ICD-10-CM

## 2011-01-10 DIAGNOSIS — Z299 Encounter for prophylactic measures, unspecified: Secondary | ICD-10-CM

## 2011-01-10 DIAGNOSIS — F4321 Adjustment disorder with depressed mood: Secondary | ICD-10-CM

## 2011-01-10 DIAGNOSIS — D649 Anemia, unspecified: Secondary | ICD-10-CM

## 2011-01-10 DIAGNOSIS — M79609 Pain in unspecified limb: Secondary | ICD-10-CM

## 2011-01-10 NOTE — Assessment & Plan Note (Signed)
Declined flu shot today.

## 2011-01-10 NOTE — Assessment & Plan Note (Signed)
Nl tsh today

## 2011-01-10 NOTE — Assessment & Plan Note (Signed)
Continue fu with Dr Ouida Sills.   Has fmla  Can help with recent out of work but more appropriate for future forms be done by Optometrist and specialist.

## 2011-01-10 NOTE — Assessment & Plan Note (Signed)
Encourage counseling for  Future needs if needed . To a void crisis situations

## 2011-01-10 NOTE — Patient Instructions (Addendum)
Healthy eating and exercise as tolerated Follow through with specialty evaluation  As ordered.  Ask about course of the hand problem as this may interfere with your work when it flares  They may need    To do the fmla form also Follow up with a counselor about   The ongoing situation.   Consider looking at work station for pain management.  Your anemia should get better with the mirena  But may want to supplement with some iron pills once a day.

## 2011-01-10 NOTE — Progress Notes (Signed)
Subjective:    Patient ID: [name], female    DOB: 20-Dec-1975, 35 y.o.   MRN: [id number]  HPI Patient comes in today for preventive visit and follow-up of medical issues. Update of her history since her last visit. Hand pain and nodules: Herbalist   Gave her some supplements  And pain levels some better stil has one know on right hand.  Neg cts evaluation. And to see [facility]  For NCSV.   Requests help with fmla form; has been back to work full time and hurts but not as bad and nodules have gone away except for  one.  [facility] .  ? Dx  Had biopsy.   Mood : had emergent psych consult who felt her situational issue and  To get her pain under control ?  mild hel;p herbalist reported to have helped   And " catnip tea"?  plus rest. BP better  Off meds  Anemia: has mirena since august to help with menses  And bleeding.    Review of Systems ROS:  GEN/ HEENTNo fever, significant weight changes sweats headaches vision problems hearing changes, CV/ PULM; No chest pain shortness of breath cough, syncope,edema  change in exercise tolerance. GI /GU: No adominal pain, vomiting, change in bowel habits. No blood in the stool. Except when had hard stool.  No significant GU symptoms. SKIN/HEME: ,no  suspicious lesions or bleeding. No lymphadenopathy, nodules, masses.  Has rash in groin some itching no rx  NEURO/ PSYCH:  No neurologic signs such as weakness numbness anxiety   Herbal catnip leaves.  IMM/ Allergy: No unusual infections.  Allergy .   Tomato causes rash.   REST of 12 system review negative except as above.  Past history family history social history reviewed in the electronic medical record. Cannot locate derm consult and biopsy scan in EHR at this time      Objective:   Physical Exam Physical Exam: Vital signs reviewed OQH:UTML is a well-developed well-nourished alert cooperative  AA female who appears her stated age in no acute distress.    HEENT: normocephalic  traumatic , Eyes: PERRL EOM's full, conjunctiva clear, Nares: paten,t no deformity discharge or tenderness., Ears: no deformity EAC's clear TMs with normal landmarks. Mouth: clear OP, no lesions, edema.  Moist mucous membranes. Dentition in adequate repair. NECK: supple without masses, thyromegaly present without nodule or bruits. CHEST/PULM:  Clear to auscultation and percussion breath sounds equal no wheeze , rales or rhonchi. No chest wall deformities or tenderness. CV: PMI is nondisplaced, S1 S2 no gallops, murmurs, rubs. Peripheral pulses are full without delay.No JVD .  Breast: normal by inspection . No dimpling, discharge, masses, tenderness or discharge .  ABDOMEN: Bowel sounds normal nontender  No guard or rebound, no hepato splenomegal no CVA tenderness.  No hernia. Extremtities:  No clubbing cyanosis or edema, no acute joint swelling or redness no focal atrophy NEURO:  Oriented x3, cranial nerves 3-12 appear to be intact, no obvious focal weakness,gait within normal limits no abnormal reflexes or asymmetrical SKIN: normal turgor, color, no bruising or petechiae. Hands show only one callused nodule on right palmar surface   In groin area faint pigmentation tinea like  PSYCH: Oriented, good eye contact, no obvious depression anxiety, cognition and judgment appear normal. Lab Results  Component Value Date   WBC 9.7 12/21/2010   HGB 11.9* 12/21/2010   HCT 38.9 12/21/2010   PLT 293.0 12/21/2010   GLUCOSE 79  12/21/2010   CHOL 203* 12/21/2010   TRIG 38.0 12/21/2010   HDL 56.80 12/21/2010   LDLDIRECT 139.2 12/21/2010   LDLCALC 113* 12/29/2008   ALT 24 12/21/2010   AST 27 12/21/2010   NA 141 12/21/2010   K 4.4 12/21/2010   CL 110 12/21/2010   CREATININE 0.6 12/21/2010   BUN 7 12/21/2010   CO2 23 12/21/2010   TSH 0.55 12/21/2010   INR 1.0 11/15/2006       Assessment & Plan:  Preventive Health Care Counseled regarding healthy nutrition, exercise, sleep, injury prevention,  calcium vit d and healthy weight . Hand pain  Under eval   Had high intesnity keyboarding job. Worse  with weather changes . Narcotic not indicated on reg basis .  To follow through and consider check work station also. Nodules to see derm may return Reactive  Depression better  Saw psych x 1 and felt to b secondry to pain and stress. Off all meds  Rash legs  Try lamisil.  For 2 weeks looks like a tinea . Anemia  Should get better with mirena  Take iron and plan follow up  Has microcytosis  Unsure if had  electrophoresis  In the past FMLA form  Difficult to predict what  Is going on but can  Fil out for previous absences . Unsure of course of problem. Call with actual dates of  Absence to be able to  Help with the form.  Counseling regarding all of the above additionally add 25 minutes to visit.

## 2011-01-10 NOTE — Assessment & Plan Note (Signed)
Improved with rest and herbal treatment  But could recur   To see  Derm at [facility].  For further opinion

## 2011-03-01 ENCOUNTER — Telehealth: Payer: Self-pay | Admitting: *Deleted

## 2011-03-01 NOTE — Telephone Encounter (Signed)
Pt is wanting a rx for iron because her periods are irregular and has been bleeding x 2 weeks.  Spoke to pt- bleeding for 2 1/2 weeks with spotting. She is worried that her iron may be too low. She is taking OTC iron once a day. Pt is not taking it with vit c. I advised pt to take iron bid with vit c until md gets back in the office. Pt agrees with this.

## 2011-03-05 NOTE — Telephone Encounter (Signed)
Pt aware and will call back with the dates for the fmla form.

## 2011-03-05 NOTE — Telephone Encounter (Signed)
Most iron preps are OTC  Talke 1 iron pill twice a day with 250 of vit c  She need s to see her GYNE if keeps bleeding.  Also never got the dates on her fmla form so never finished . Please advise about this.  Needs fu anemia

## 2011-03-30 ENCOUNTER — Ambulatory Visit: Payer: PRIVATE HEALTH INSURANCE | Admitting: Internal Medicine

## 2011-08-03 ENCOUNTER — Telehealth: Payer: Self-pay

## 2011-08-03 NOTE — Telephone Encounter (Signed)
Spoke with pt and pt is aware

## 2011-08-03 NOTE — Telephone Encounter (Signed)
I dont have any  New ideas  At this time   She can go back to the specialist  ? Derm? For the hand nodules.  (Advise a follow up visit     At some point. May not be until early June because  Ill be out of office for a week in MAY. )

## 2011-08-03 NOTE — Telephone Encounter (Signed)
Pt would like Dr. Regis Bill to know that her hand pain/ problems are coming back. Pt states it is causing her fibromyalgia to flare up as well.  Pt states the nodules in her hands are back again as well.  Pt states she went to [facility] and was told to use the cream on her hand but pt states it is not working.  Pt would like to know what Dr. Regis Bill recommends as the next step.

## 2012-01-17 ENCOUNTER — Telehealth: Payer: Self-pay | Admitting: Family Medicine

## 2012-01-17 NOTE — Telephone Encounter (Signed)
Pt would like to be referred to pain management.  Says her hands are hurting again.  Has been treated with a  Hand cream that does not work by Dr. Kris Mouton at [facility].  Would also like a different rheumatoid doctor.  Seen Dr. Ouida Sills in the past but does not care for his services.

## 2012-01-17 NOTE — Telephone Encounter (Signed)
Please refer for pain  Medicine but will need the record s from our evalution and any others  For the . Hand pain   Could see dr August Luz for rheumatology  .

## 2012-01-21 ENCOUNTER — Other Ambulatory Visit: Payer: Self-pay | Admitting: Family Medicine

## 2012-01-21 DIAGNOSIS — IMO0001 Reserved for inherently not codable concepts without codable children: Secondary | ICD-10-CM

## 2012-01-21 DIAGNOSIS — M79643 Pain in unspecified hand: Secondary | ICD-10-CM

## 2012-01-21 NOTE — Telephone Encounter (Signed)
Orders placed in the computer. 

## 2012-01-28 ENCOUNTER — Encounter: Payer: Self-pay | Admitting: Physical Medicine & Rehabilitation

## 2012-01-30 ENCOUNTER — Telehealth: Payer: Self-pay | Admitting: Internal Medicine

## 2012-01-30 NOTE — Telephone Encounter (Signed)
Caller: Nadege/Patient is calling about pain in her hands.  Pt. has an appointment with a Rheumatologist Dr. Letta Pate with [facility] on 02/11/12.  Pt. is asking that Dr. Regis Bill prescribe additional pain medicine to get her though until the appointment, as the Ibuprofen 875m Q 4 hrs while awake is not effective.   Triaged per Hand Non-Injury and the Disposition for New onset mild to moderate pain that has not improved with 24 hours of home care:  See Provider within 24 hours.  PT. REQUESTS THAT NOTE BE SENT TO DR. PANOSH, AS SHE IS AFRAID TO TAKE OFF TIME FROM NEW JOB, AND IS IN THE 90 DAY PROBATION PERIOD.  Please advise.  Care instructions deferred to Provider.  db/CAN.

## 2012-01-31 NOTE — Telephone Encounter (Signed)
i am not sure what will work   Make sure she doesn't have a fever . Can rx ultram 50 mg disp #15 1 po tid prn pain but this is a controlled substance and shouldn't drive with this .

## 2012-01-31 NOTE — Telephone Encounter (Signed)
Left message on cell for the pt to return my call.

## 2012-02-01 ENCOUNTER — Other Ambulatory Visit: Payer: Self-pay | Admitting: Internal Medicine

## 2012-02-01 MED ORDER — TRAMADOL HCL 50 MG PO TABS: 50.0000 mg | ORAL_TABLET | Freq: Three times a day (TID) | ORAL | Status: DC | PRN

## 2012-02-01 NOTE — Telephone Encounter (Signed)
Pt denied any fever.  Instructed to not drive with the medication.  Sent in to [facility] on [address].

## 2012-02-01 NOTE — Telephone Encounter (Signed)
Patient calling back because she has not heard. I told her we called and LMOM on her cell. She cannot use her cell at work. Please call her at the work number provided below [phone]). Thanks.

## 2012-02-11 ENCOUNTER — Ambulatory Visit: Payer: PRIVATE HEALTH INSURANCE | Admitting: Physical Medicine & Rehabilitation

## 2012-02-11 ENCOUNTER — Ambulatory Visit: Payer: Self-pay | Admitting: Physical Medicine & Rehabilitation

## 2012-02-29 ENCOUNTER — Ambulatory Visit (HOSPITAL_BASED_OUTPATIENT_CLINIC_OR_DEPARTMENT_OTHER): Payer: BC Managed Care – HMO | Admitting: Physical Medicine & Rehabilitation

## 2012-02-29 ENCOUNTER — Encounter: Payer: BC Managed Care – HMO | Attending: Physical Medicine & Rehabilitation

## 2012-02-29 ENCOUNTER — Encounter: Payer: Self-pay | Admitting: Physical Medicine & Rehabilitation

## 2012-02-29 VITALS — BP 132/84 | HR 96 | Resp 14 | Ht 65.0 in | Wt 198.0 lb

## 2012-02-29 DIAGNOSIS — M79609 Pain in unspecified limb: Secondary | ICD-10-CM | POA: Insufficient documentation

## 2012-02-29 DIAGNOSIS — Z5181 Encounter for therapeutic drug level monitoring: Secondary | ICD-10-CM

## 2012-02-29 DIAGNOSIS — M180 Bilateral primary osteoarthritis of first carpometacarpal joints: Secondary | ICD-10-CM

## 2012-02-29 DIAGNOSIS — L851 Acquired keratosis [keratoderma] palmaris et plantaris: Secondary | ICD-10-CM | POA: Insufficient documentation

## 2012-02-29 DIAGNOSIS — M25549 Pain in joints of unspecified hand: Secondary | ICD-10-CM | POA: Insufficient documentation

## 2012-02-29 DIAGNOSIS — G5600 Carpal tunnel syndrome, unspecified upper limb: Secondary | ICD-10-CM

## 2012-02-29 DIAGNOSIS — M19049 Primary osteoarthritis, unspecified hand: Secondary | ICD-10-CM

## 2012-02-29 DIAGNOSIS — G5603 Carpal tunnel syndrome, bilateral upper limbs: Secondary | ICD-10-CM

## 2012-02-29 DIAGNOSIS — M79643 Pain in unspecified hand: Secondary | ICD-10-CM

## 2012-02-29 MED ORDER — DICLOFENAC SODIUM 1 % TD GEL: 2.0000 g | Freq: Four times a day (QID) | TRANSDERMAL | Status: DC

## 2012-02-29 NOTE — Patient Instructions (Addendum)
Thumb spica splint for base of the thumb arthritis Also use diclofenac gel for base of thumb arthritis  EMG/NCV test next visit to look for pinched nerve at wrist or elbow
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
## 2012-04-04 ENCOUNTER — Ambulatory Visit: Payer: BC Managed Care – HMO | Admitting: Physical Medicine & Rehabilitation

## 2012-04-25 ENCOUNTER — Ambulatory Visit: Payer: BC Managed Care – HMO | Admitting: Physical Medicine & Rehabilitation

## 2012-11-18 ENCOUNTER — Encounter: Payer: Self-pay | Admitting: Internal Medicine

## 2012-11-18 ENCOUNTER — Ambulatory Visit (INDEPENDENT_AMBULATORY_CARE_PROVIDER_SITE_OTHER): Payer: BC Managed Care – PPO | Admitting: Internal Medicine

## 2012-11-18 VITALS — BP 142/84 | HR 114 | Temp 98.0°F | Wt 186.0 lb

## 2012-11-18 DIAGNOSIS — M79609 Pain in unspecified limb: Secondary | ICD-10-CM

## 2012-11-18 DIAGNOSIS — M797 Fibromyalgia: Secondary | ICD-10-CM

## 2012-11-18 DIAGNOSIS — R5381 Other malaise: Secondary | ICD-10-CM

## 2012-11-18 DIAGNOSIS — D649 Anemia, unspecified: Secondary | ICD-10-CM

## 2012-11-18 DIAGNOSIS — F4321 Adjustment disorder with depressed mood: Secondary | ICD-10-CM

## 2012-11-18 DIAGNOSIS — IMO0001 Reserved for inherently not codable concepts without codable children: Secondary | ICD-10-CM

## 2012-11-18 DIAGNOSIS — I10 Essential (primary) hypertension: Secondary | ICD-10-CM

## 2012-11-18 LAB — TSH: TSH: 0.96 u[IU]/mL (ref 0.35–5.50)

## 2012-11-18 LAB — BASIC METABOLIC PANEL
CO2: 27 mEq/L (ref 19–32)
Calcium: 9.3 mg/dL (ref 8.4–10.5)
Creatinine, Ser: 0.6 mg/dL (ref 0.4–1.2)

## 2012-11-18 LAB — IBC PANEL: Transferrin: 290.5 mg/dL (ref 212.0–360.0)

## 2012-11-18 LAB — T3, FREE: T3, Free: 3 pg/mL (ref 2.3–4.2)

## 2012-11-18 LAB — HEPATIC FUNCTION PANEL
Bilirubin, Direct: 0 mg/dL (ref 0.0–0.3)
Total Protein: 7.6 g/dL (ref 6.0–8.3)

## 2012-11-18 LAB — T4, FREE: Free T4: 0.88 ng/dL (ref 0.60–1.60)

## 2012-11-18 MED ORDER — ESCITALOPRAM OXALATE 10 MG PO TABS: ORAL_TABLET | ORAL | Status: DC

## 2012-11-18 NOTE — Patient Instructions (Signed)
Depression and stress makes pain worse. Lets try lexapro for now  Continue counseling   Thyroid tests etc. ROV in 3 weeks or so .  Then make a future plan.
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
## 2012-11-22 ENCOUNTER — Encounter: Payer: Self-pay | Admitting: Internal Medicine

## 2012-11-22 DIAGNOSIS — R5381 Other malaise: Secondary | ICD-10-CM | POA: Insufficient documentation

## 2012-11-22 DIAGNOSIS — M797 Fibromyalgia: Secondary | ICD-10-CM | POA: Insufficient documentation

## 2012-12-02 ENCOUNTER — Telehealth: Payer: Self-pay | Admitting: Internal Medicine

## 2012-12-02 NOTE — Telephone Encounter (Signed)
Patient Information:  Caller Name: Anniece  Phone: [phone]  Patient: [name]  Gender: Female  DOB: Nov 14, 1975  Age: 37 Years  PCP: Shanon Ace (Family Practice)  Pregnant: No  Office Follow Up:  Does the office need to follow up with this patient?: No  Instructions For The Office: N/A  RN Note:  Patient states her employer will not allow her to wear dark slacks to work ("to hide my spills because I'm bleeding too hard.")  Wants to know if her iron is low.  Wants to have her lab results from office visit 11/18/12.  Advised ferritin level 36 (N=[id number]).  States she believes her fibromyalgia is flaring up due to work stress.  States has started a new type of BCP this month to try to regulate her heavy periods.  States she is going through 6+ pads daily.  Per vaginal bleeding protocol, advised appt within 72 hours due to periods with > 6 soaked pads per day; offered appt today.  States unable to come to office and will call GYN to see if GYN will manage this for her.  INfo to office; to call back if unable to get appt.  krs/can  Symptoms  Reason For Call & Symptoms: heavy period  Reviewed Health History In EMR: Yes  Reviewed Medications In EMR: Yes  Reviewed Allergies In EMR: Yes  Reviewed Surgeries / Procedures: Yes  Date of Onset of Symptoms: 11/28/2012 OB / GYN:  LMP: 11/27/2012  Guideline(s) Used:  Vaginal Bleeding - Abnormal  Disposition Per Guideline:   See Within 3 Days in Office  Reason For Disposition Reached:   Periods with > 6 soaked pads or tampons per day  Advice Given:  N/A  Patient Refused Recommendation:  Patient Will Follow Up With Office Later  will check with GYN office krs/can

## 2012-12-02 NOTE — Telephone Encounter (Signed)
noted 

## 2012-12-09 ENCOUNTER — Encounter: Payer: Self-pay | Admitting: Internal Medicine

## 2012-12-09 ENCOUNTER — Ambulatory Visit (INDEPENDENT_AMBULATORY_CARE_PROVIDER_SITE_OTHER): Payer: BC Managed Care – PPO | Admitting: Internal Medicine

## 2012-12-09 VITALS — BP 144/100 | HR 123 | Temp 97.7°F | Wt 190.0 lb

## 2012-12-09 DIAGNOSIS — Z566 Other physical and mental strain related to work: Secondary | ICD-10-CM | POA: Insufficient documentation

## 2012-12-09 DIAGNOSIS — D509 Iron deficiency anemia, unspecified: Secondary | ICD-10-CM | POA: Insufficient documentation

## 2012-12-09 DIAGNOSIS — IMO0001 Reserved for inherently not codable concepts without codable children: Secondary | ICD-10-CM

## 2012-12-09 DIAGNOSIS — D649 Anemia, unspecified: Secondary | ICD-10-CM

## 2012-12-09 DIAGNOSIS — Z569 Unspecified problems related to employment: Secondary | ICD-10-CM

## 2012-12-09 DIAGNOSIS — F4321 Adjustment disorder with depressed mood: Secondary | ICD-10-CM

## 2012-12-09 DIAGNOSIS — E876 Hypokalemia: Secondary | ICD-10-CM

## 2012-12-09 DIAGNOSIS — Z91120 Patient's intentional underdosing of medication regimen due to financial hardship: Secondary | ICD-10-CM | POA: Insufficient documentation

## 2012-12-09 DIAGNOSIS — Z23 Encounter for immunization: Secondary | ICD-10-CM

## 2012-12-09 DIAGNOSIS — Y639 Failure in dosage during unspecified surgical and medical care: Secondary | ICD-10-CM

## 2012-12-09 DIAGNOSIS — M797 Fibromyalgia: Secondary | ICD-10-CM

## 2012-12-09 MED ORDER — DESIPRAMINE HCL 10 MG PO TABS: 10.0000 mg | ORAL_TABLET | Freq: Every day | ORAL | Status: DC
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
## 2012-12-09 NOTE — Patient Instructions (Addendum)
Read information about fm Try low dose med given at night instead of the lexapro given. Take the iron twice a day.  Monitor your periods. cbcdiff in 6-8 weeks  And chemistry . E mail me the dates out of work and also  Update on that medicine .

## 2012-12-10 ENCOUNTER — Ambulatory Visit: Payer: BC Managed Care – PPO | Admitting: Internal Medicine

## 2012-12-29 ENCOUNTER — Telehealth: Payer: Self-pay | Admitting: Internal Medicine

## 2012-12-29 NOTE — Telephone Encounter (Signed)
Pt is calling to inquire about FMLA paperwork that she states that she gave to Dr. Regis Bill on 9/16, when she was seen. Please assist.

## 2012-12-31 NOTE — Telephone Encounter (Signed)
Done  Misty please  Make sure copied for record.

## 2012-12-31 NOTE — Telephone Encounter (Signed)
Patient notified that paperwork is complete.  Sent to the front along with a note for the paperwork to be faxed and scanned.

## 2013-01-19 ENCOUNTER — Other Ambulatory Visit: Payer: BC Managed Care – PPO

## 2013-01-22 ENCOUNTER — Telehealth: Payer: Self-pay | Admitting: Internal Medicine

## 2013-01-22 NOTE — Telephone Encounter (Signed)
Pt would like  A copy of her flu shot fax to [employer] [phone]

## 2013-01-23 ENCOUNTER — Other Ambulatory Visit: Payer: BC Managed Care – PPO

## 2013-01-23 NOTE — Telephone Encounter (Signed)
Can we do this?  Doesn't look like it is being faxed to her.

## 2013-01-26 ENCOUNTER — Ambulatory Visit: Payer: BC Managed Care – PPO | Admitting: Internal Medicine

## 2013-01-26 NOTE — Telephone Encounter (Signed)
[name], please contact pt and inform her of release of record protocol.

## 2013-02-03 ENCOUNTER — Ambulatory Visit: Payer: BC Managed Care – PPO | Admitting: Internal Medicine

## 2013-02-10 NOTE — Telephone Encounter (Signed)
Call pt back and let her know that we need a signed authorization and copy of her license.

## 2013-03-02 ENCOUNTER — Telehealth: Payer: Self-pay | Admitting: Internal Medicine

## 2013-03-02 NOTE — Telephone Encounter (Signed)
Pt would like a note stating that it is necessary for her to go to the restroom when she needs to go, even if it is several times a day. Pt states her fibromyalgia is flaring up and her employer is giving her a hard time about having to get up often.

## 2013-03-04 ENCOUNTER — Encounter: Payer: Self-pay | Admitting: Internal Medicine

## 2013-04-20 ENCOUNTER — Encounter: Payer: Self-pay | Admitting: Internal Medicine

## 2014-03-29 ENCOUNTER — Encounter: Payer: Self-pay | Admitting: Internal Medicine

## 2014-03-29 ENCOUNTER — Ambulatory Visit (INDEPENDENT_AMBULATORY_CARE_PROVIDER_SITE_OTHER): Payer: Managed Care, Other (non HMO) | Admitting: Internal Medicine

## 2014-03-29 VITALS — BP 128/70 | HR 117 | Temp 98.5°F | Ht 65.0 in | Wt 181.5 lb

## 2014-03-29 DIAGNOSIS — I471 Supraventricular tachycardia: Secondary | ICD-10-CM

## 2014-03-29 DIAGNOSIS — R000 Tachycardia, unspecified: Secondary | ICD-10-CM

## 2014-03-29 DIAGNOSIS — R55 Syncope and collapse: Secondary | ICD-10-CM

## 2014-03-29 NOTE — Patient Instructions (Signed)
Lab work  to check for anemia  Avoid supplements for now incase adding to any sx . except iron and vit c 250 gm  To help absorption  ekg is good except fast heart rate . If  No explanation from labs  Advise stay hydrated and we will get cardiology consult about the fainting .  Your exam is normal today except for the  Fast heart rate.   Syncope Syncope is a medical term for fainting or passing out. This means you lose consciousness and drop to the ground. People are generally unconscious for less than 5 minutes. You may have some muscle twitches for up to 15 seconds before waking up and returning to normal. Syncope occurs more often in older adults, but it can happen to anyone. While most causes of syncope are not dangerous, syncope can be a sign of a serious medical problem. It is important to seek medical care.  CAUSES  Syncope is caused by a sudden drop in blood flow to the brain. The specific cause is often not determined. Factors that can bring on syncope include:  Taking medicines that lower blood pressure.  Sudden changes in posture, such as standing up quickly.  Taking more medicine than prescribed.  Standing in one place for too long.  Seizure disorders.  Dehydration and excessive exposure to heat.  Low blood sugar (hypoglycemia).  Straining to have a bowel movement.  Heart disease, irregular heartbeat, or other circulatory problems.  Fear, emotional distress, seeing blood, or severe pain. SYMPTOMS  Right before fainting, you may:  Feel dizzy or light-headed.  Feel nauseous.  See all white or all black in your field of vision.  Have cold, clammy skin. DIAGNOSIS  Your health care provider will ask about your symptoms, perform a physical exam, and perform an electrocardiogram (ECG) to record the electrical activity of your heart. Your health care provider may also perform other heart or blood tests to determine the cause of your syncope which may  include:  Transthoracic echocardiogram (TTE). During echocardiography, sound waves are used to evaluate how blood flows through your heart.  Transesophageal echocardiogram (TEE).  Cardiac monitoring. This allows your health care provider to monitor your heart rate and rhythm in real time.  Holter monitor. This is a portable device that records your heartbeat and can help diagnose heart arrhythmias. It allows your health care provider to track your heart activity for several days, if needed.  Stress tests by exercise or by giving medicine that makes the heart beat faster. TREATMENT  In most cases, no treatment is needed. Depending on the cause of your syncope, your health care provider may recommend changing or stopping some of your medicines. HOME CARE INSTRUCTIONS  Have someone stay with you until you feel stable.  Do not drive, use machinery, or play sports until your health care provider says it is okay.  Keep all follow-up appointments as directed by your health care provider.  Lie down right away if you start feeling like you might faint. Breathe deeply and steadily. Wait until all the symptoms have passed.  Drink enough fluids to keep your urine clear or pale yellow.  If you are taking blood pressure or heart medicine, get up slowly and take several minutes to sit and then stand. This can reduce dizziness. SEEK IMMEDIATE MEDICAL CARE IF:   You have a severe headache.  You have unusual pain in the chest, abdomen, or back.  You are bleeding from your mouth or rectum, or  you have black or tarry stool.  You have an irregular or very fast heartbeat.  You have pain with breathing.  You have repeated fainting or seizure-like jerking during an episode.  You faint when sitting or lying down.  You have confusion.  You have trouble walking.  You have severe weakness.  You have vision problems. If you fainted, call your local emergency services (911 in [place] Do not drive  yourself to the hospital.  MAKE SURE YOU:  Understand these instructions.  Will watch your condition.  Will get help right away if you are not doing well or get worse. Document Released: 03/12/2005 Document Revised: 03/17/2013 Document Reviewed: 05/11/2011 [facility] Patient Information 2015 [place], [place]. This information is not intended to replace advice given to you by your health care provider. Make sure you discuss any questions you have with your health care provider.
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
## 2014-03-30 ENCOUNTER — Other Ambulatory Visit: Payer: Self-pay | Admitting: Family Medicine

## 2014-03-30 ENCOUNTER — Telehealth: Payer: Self-pay | Admitting: *Deleted

## 2014-03-30 ENCOUNTER — Telehealth: Payer: Self-pay | Admitting: Family Medicine

## 2014-03-30 DIAGNOSIS — D649 Anemia, unspecified: Secondary | ICD-10-CM

## 2014-03-30 LAB — CBC WITH DIFFERENTIAL/PLATELET
Basophils Absolute: 0 10*3/uL (ref 0.0–0.1)
Basophils Relative: 0 % (ref 0.0–3.0)
EOS ABS: 0.3 10*3/uL (ref 0.0–0.7)
Eosinophils Relative: 3 % (ref 0.0–5.0)
HCT: 22.9 % — CL (ref 36.0–46.0)
Lymphocytes Relative: 31 % (ref 12.0–46.0)
Lymphs Abs: 3.2 10*3/uL (ref 0.7–4.0)
MCV: 50.9 fl — ABNORMAL LOW (ref 78.0–100.0)
MONO ABS: 0.7 10*3/uL (ref 0.1–1.0)
Monocytes Relative: 7 % (ref 3.0–12.0)
NEUTROS ABS: 6.1 10*3/uL (ref 1.4–7.7)
NEUTROS PCT: 59 % (ref 43.0–77.0)
Platelets: 900 10*3/uL — ABNORMAL HIGH (ref 150.0–400.0)
RBC: 4.49 Mil/uL (ref 3.87–5.11)
RDW: 22.3 % — AB (ref 11.5–15.5)
WBC: 10.4 10*3/uL (ref 4.0–10.5)

## 2014-03-30 LAB — BASIC METABOLIC PANEL
BUN: 9 mg/dL (ref 6–23)
CALCIUM: 9.5 mg/dL (ref 8.4–10.5)
CO2: 21 meq/L (ref 19–32)
Chloride: 108 mEq/L (ref 96–112)
Creatinine, Ser: 0.8 mg/dL (ref 0.4–1.2)
GFR: 100.86 mL/min (ref >60.00)
Glucose, Bld: 80 mg/dL (ref 70–99)
Potassium: 3.9 mEq/L (ref 3.5–5.1)
Sodium: 141 mEq/L (ref 135–145)

## 2014-03-30 LAB — T3, FREE: T3, Free: 3.3 pg/mL (ref 2.3–4.2)

## 2014-03-30 LAB — HEPATIC FUNCTION PANEL
ALK PHOS: 78 U/L (ref 39–117)
ALT: 9 U/L (ref 0–35)
AST: 21 U/L (ref 0–37)
Albumin: 4.2 g/dL (ref 3.5–5.2)
BILIRUBIN DIRECT: 0 mg/dL (ref 0.0–0.3)
TOTAL PROTEIN: 8.5 g/dL — AB (ref 6.0–8.3)
Total Bilirubin: 0.1 mg/dL — ABNORMAL LOW (ref 0.2–1.2)

## 2014-03-30 LAB — LIPID PANEL
CHOLESTEROL: 186 mg/dL (ref 0–200)
HDL: 40.3 mg/dL (ref >39.00)
LDL Cholesterol: 114 mg/dL — ABNORMAL HIGH (ref 0–99)
NonHDL: 145.7
Total CHOL/HDL Ratio: 5
Triglycerides: 159 mg/dL — ABNORMAL HIGH (ref 0.0–149.0)
VLDL: 31.8 mg/dL (ref 0.0–40.0)

## 2014-03-30 LAB — T4, FREE: Free T4: 0.86 ng/dL (ref 0.60–1.60)

## 2014-03-30 LAB — TSH: TSH: 2.34 u[IU]/mL (ref 0.35–4.50)

## 2014-03-30 LAB — HEMOGLOBIN A1C: HEMOGLOBIN A1C: 5.8 % (ref 4.6–6.5)

## 2014-03-30 MED ORDER — FERROUS SULFATE 325 (65 FE) MG PO TABS: 325.0000 mg | ORAL_TABLET | Freq: Three times a day (TID) | ORAL | Status: DC

## 2014-03-30 NOTE — Telephone Encounter (Signed)
Pt will need appt ASAP with gynecologist.  Pt will call back with name/office and number today.

## 2014-03-30 NOTE — Telephone Encounter (Signed)
Margarita Grizzle called from the lab at [facility] to inform Dr Regis Bill the pt has a critical hemoglobin result of 6.2.  The message was written down and given to Dr Regis Bill.

## 2014-03-30 NOTE — Telephone Encounter (Signed)
Results are in the system.

## 2014-03-30 NOTE — Telephone Encounter (Signed)
Please obtain the   Full lab results  Cbc diff  Etc  ASAP    not in the ehr   That i can see So  I  We can contact the patient about results and plan

## 2014-03-31 NOTE — Telephone Encounter (Signed)
Spoke to Penryn at [facility].  She will report lab results to the oncall physician and will get his/her directions.  She stated she will call the pt in the next few days.  She will also call me back to let me know when she will be seen.  Left the pt a message on her cell informing her of all.  Instructed her to call back if any questions.

## 2014-03-31 NOTE — Telephone Encounter (Signed)
Its [facility]  All they know is that it is a PA, not an MD.

## 2014-04-02 ENCOUNTER — Telehealth: Payer: Self-pay | Admitting: Internal Medicine

## 2014-04-02 NOTE — Telephone Encounter (Signed)
Spoke with pt. Dr. Regis Bill would like for her to f/u in the office on Wed after labs are done on Tuesday. Appointment scheduled

## 2014-04-02 NOTE — Telephone Encounter (Signed)
Pt would like a call back about her low blood count

## 2014-04-06 ENCOUNTER — Other Ambulatory Visit: Payer: Managed Care, Other (non HMO)

## 2014-04-06 ENCOUNTER — Other Ambulatory Visit (INDEPENDENT_AMBULATORY_CARE_PROVIDER_SITE_OTHER): Payer: Managed Care, Other (non HMO)

## 2014-04-06 ENCOUNTER — Telehealth: Payer: Self-pay | Admitting: Internal Medicine

## 2014-04-06 ENCOUNTER — Telehealth: Payer: Self-pay | Admitting: *Deleted

## 2014-04-06 DIAGNOSIS — D649 Anemia, unspecified: Secondary | ICD-10-CM

## 2014-04-06 LAB — CBC WITH DIFFERENTIAL/PLATELET
BASOS PCT: 0 % (ref 0.0–3.0)
EOS PCT: 5 % (ref 0.0–5.0)
HCT: 23.4 % — CL (ref 36.0–46.0)
Hemoglobin: 6.2 g/dL — CL (ref 12.0–15.0)
Lymphocytes Relative: 13 % (ref 12.0–46.0)
MCHC: 26.6 g/dL — ABNORMAL LOW (ref 30.0–36.0)
MONOS PCT: 4 % (ref 3.0–12.0)
Neutrophils Relative %: 82 % — ABNORMAL HIGH (ref 43.0–77.0)
RBC: 4.4 Mil/uL (ref 3.87–5.11)
RDW: 25.5 % — ABNORMAL HIGH (ref 11.5–15.5)
WBC: 9.9 10*3/uL (ref 4.0–10.5)

## 2014-04-06 LAB — IBC PANEL
Iron: 16 ug/dL — ABNORMAL LOW (ref 42–145)
Saturation Ratios: 3.1 % — ABNORMAL LOW (ref 20.0–50.0)
Transferrin: 369.2 mg/dL — ABNORMAL HIGH (ref 212.0–360.0)

## 2014-04-06 LAB — FERRITIN: Ferritin: 5.1 ng/mL — ABNORMAL LOW (ref 10.0–291.0)

## 2014-04-06 NOTE — Telephone Encounter (Signed)
Lab reported to me  And told to do vs as pt has appt tomorrow .  HG the same  bp was normal  And pulse was in the 110 range . Doesn't hav a gyne appt yet cause of bills to be paid  She is due for a period  In about 10-14 days.   And we dont want her to bleed.  Iron sat is low  Has appt in am . Keep it .  Show  got to ed if feeling bad.  May need to get hematology  Involved .

## 2014-04-06 NOTE — Telephone Encounter (Signed)
Pt states she spoke w/ you and advised to cb.  Pt refused to leave any info, just would like a cb.

## 2014-04-06 NOTE — Addendum Note (Signed)
Addended byShanon Ace K on: 04/06/2014 05:52 PM   Modules accepted: Orders

## 2014-04-06 NOTE — Telephone Encounter (Signed)
Hope from [facility] lab is calling with a hgb 6.2.  They will have to do a "manual dip" .  Results will be put in the chart later.  Please advise.

## 2014-04-06 NOTE — Telephone Encounter (Signed)
I spoke with pt and went over below information. Pt did agree to follow advice and would be here on 04/07/14 to see Dr. Regis Bill.

## 2014-04-07 ENCOUNTER — Encounter: Payer: Self-pay | Admitting: Internal Medicine

## 2014-04-07 ENCOUNTER — Ambulatory Visit (INDEPENDENT_AMBULATORY_CARE_PROVIDER_SITE_OTHER): Payer: Managed Care, Other (non HMO) | Admitting: Internal Medicine

## 2014-04-07 VITALS — BP 118/90 | HR 112 | Temp 97.8°F | Wt 178.0 lb

## 2014-04-07 DIAGNOSIS — D649 Anemia, unspecified: Secondary | ICD-10-CM
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
## 2014-04-07 NOTE — Telephone Encounter (Signed)
Pt sees [facility].  She had a balance and they would not schedule an appt until the balance was paid in full.  Dr Paula Compton ended up calling in Aygestin 5 mg once daily but informed pt she can't be seen there as they were unable to help her.  Now pt needs a new GYN.  She has an appt today with Dr Regis Bill at 3 pm.  Confirmed appt with pt and informed pt that I would forward this information to Dr Regis Bill for review and Dr Regis Bill would discuss options at time of visit.  Pt verbalized understanding and had no questions

## 2014-04-07 NOTE — Patient Instructions (Signed)
Note for work no exertional physical activity for now. Get the medication needed to stop period from the gyne.  Referred to hematology. asap.

## 2014-04-08 ENCOUNTER — Other Ambulatory Visit (HOSPITAL_BASED_OUTPATIENT_CLINIC_OR_DEPARTMENT_OTHER): Payer: Managed Care, Other (non HMO) | Admitting: Lab

## 2014-04-08 ENCOUNTER — Ambulatory Visit: Payer: Managed Care, Other (non HMO)

## 2014-04-08 ENCOUNTER — Ambulatory Visit (HOSPITAL_BASED_OUTPATIENT_CLINIC_OR_DEPARTMENT_OTHER): Payer: Managed Care, Other (non HMO)

## 2014-04-08 ENCOUNTER — Encounter: Payer: Self-pay | Admitting: Family

## 2014-04-08 ENCOUNTER — Ambulatory Visit (HOSPITAL_BASED_OUTPATIENT_CLINIC_OR_DEPARTMENT_OTHER): Payer: Managed Care, Other (non HMO) | Admitting: Family

## 2014-04-08 VITALS — BP 114/77 | HR 107 | Temp 98.0°F | Resp 16

## 2014-04-08 VITALS — BP 132/74 | HR 110 | Temp 99.2°F | Resp 14 | Ht 65.0 in | Wt 180.0 lb

## 2014-04-08 DIAGNOSIS — D509 Iron deficiency anemia, unspecified: Secondary | ICD-10-CM

## 2014-04-08 DIAGNOSIS — D500 Iron deficiency anemia secondary to blood loss (chronic): Secondary | ICD-10-CM

## 2014-04-08 LAB — CBC WITH DIFFERENTIAL (CANCER CENTER ONLY)
BASO#: 0 10*3/uL (ref 0.0–0.2)
BASO%: 0.2 % (ref 0.0–2.0)
EOS ABS: 0.2 10*3/uL (ref 0.0–0.5)
EOS%: 1.6 % (ref 0.0–7.0)
HCT: 23.6 % — ABNORMAL LOW (ref 34.8–46.6)
HGB: 6.1 g/dL — CL (ref 11.6–15.9)
LYMPH#: 2.5 10*3/uL (ref 0.9–3.3)
LYMPH%: 23.1 % (ref 14.0–48.0)
MCH: 14.1 pg — ABNORMAL LOW (ref 26.0–34.0)
MCHC: 25.8 g/dL — AB (ref 32.0–36.0)
MCV: 55 fL — ABNORMAL LOW (ref 81–101)
MONO#: 0.8 10*3/uL (ref 0.1–0.9)
MONO%: 7.1 % (ref 0.0–13.0)
NEUT%: 68 % (ref 39.6–80.0)
NEUTROS ABS: 7.4 10*3/uL — AB (ref 1.5–6.5)
PLATELETS: 905 10*3/uL — AB (ref 145–400)
RBC: 4.33 10*6/uL (ref 3.70–5.32)
RDW: 23.9 % — AB (ref 11.1–15.7)
WBC: 10.9 10*3/uL — ABNORMAL HIGH (ref 3.9–10.0)

## 2014-04-08 LAB — CHCC SATELLITE - SMEAR

## 2014-04-08 LAB — TECHNOLOGIST REVIEW CHCC SATELLITE

## 2014-04-08 MED ORDER — SODIUM CHLORIDE 0.9 % IV SOLN
510.0000 mg | Freq: Once | INTRAVENOUS | Status: AC
  Administered 2014-04-08: 510 mg via INTRAVENOUS
  Filled 2014-04-08: qty 17

## 2014-04-08 NOTE — Patient Instructions (Signed)
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
## 2014-04-09 ENCOUNTER — Other Ambulatory Visit: Payer: Self-pay | Admitting: Nurse Practitioner

## 2014-04-09 ENCOUNTER — Encounter: Payer: Self-pay | Admitting: Nurse Practitioner

## 2014-04-09 LAB — IRON AND TIBC CHCC
%SAT: 3 % — AB (ref 21–57)
Iron: 16 ug/dL — ABNORMAL LOW (ref 41–142)
TIBC: 473 ug/dL — ABNORMAL HIGH (ref 236–444)
UIBC: 456 ug/dL — AB (ref 120–384)

## 2014-04-12 LAB — HEMOGLOBINOPATHY EVALUATION
HEMOGLOBIN OTHER: 0 %
HGB A2 QUANT: 4.2 % — AB (ref 2.2–3.2)
HGB A: 95.8 % — AB (ref 96.8–97.8)
HGB F QUANT: 0 % (ref 0.0–2.0)
Hgb S Quant: 0 %

## 2014-04-12 LAB — RETICULOCYTES (CHCC)
ABS Retic: 132 10*3/uL (ref 19.0–186.0)
RBC.: 4.4 MIL/uL (ref 3.87–5.11)
Retic Ct Pct: 3 % — ABNORMAL HIGH (ref 0.4–2.3)

## 2014-04-16 ENCOUNTER — Telehealth: Payer: Self-pay | Admitting: Hematology & Oncology

## 2014-04-16 ENCOUNTER — Ambulatory Visit: Payer: Managed Care, Other (non HMO)

## 2014-04-16 ENCOUNTER — Ambulatory Visit (HOSPITAL_BASED_OUTPATIENT_CLINIC_OR_DEPARTMENT_OTHER): Payer: Managed Care, Other (non HMO)

## 2014-04-16 ENCOUNTER — Other Ambulatory Visit: Payer: Self-pay | Admitting: Family

## 2014-04-16 VITALS — BP 138/79 | HR 112 | Temp 98.0°F | Resp 20

## 2014-04-16 DIAGNOSIS — D509 Iron deficiency anemia, unspecified: Secondary | ICD-10-CM

## 2014-04-16 LAB — ALPHA-THALASSEMIA GENOTYPR

## 2014-04-16 MED ORDER — SODIUM CHLORIDE 0.9 % IV SOLN
510.0000 mg | Freq: Once | INTRAVENOUS | Status: AC
  Administered 2014-04-16: 510 mg via INTRAVENOUS
  Filled 2014-04-16: qty 17

## 2014-04-16 MED ORDER — SODIUM CHLORIDE 0.9 % IV SOLN
Freq: Once | INTRAVENOUS | Status: AC
  Administered 2014-04-16: 10:00:00 via INTRAVENOUS

## 2014-04-16 NOTE — Telephone Encounter (Signed)
Left pt message with 2-19 appointment

## 2014-04-16 NOTE — Patient Instructions (Signed)

## 2014-04-22 ENCOUNTER — Other Ambulatory Visit: Payer: Self-pay | Admitting: Family

## 2014-04-22 MED ORDER — FOLIC ACID 1 MG PO TABS: 1.0000 mg | ORAL_TABLET | Freq: Every day | ORAL | Status: DC

## 2014-04-22 NOTE — Progress Notes (Signed)
Went over [name] labs with her and also sent prescription for folic acid to her pharmacy.

## 2014-05-03 ENCOUNTER — Telehealth: Payer: Self-pay | Admitting: Internal Medicine

## 2014-05-03 NOTE — Telephone Encounter (Signed)
Patient states her cycle has started back and she is feeling extreme fatigue along with light headedness.  She would like to know if Dr. Regis Bill can take her out of work?

## 2014-05-03 NOTE — Telephone Encounter (Signed)
Spoke to the pt.  She complains of fatigue, lightheadedness and sob.  Pt to be seen in [place] on 05/04/14 @ 8:45 AM.

## 2014-05-04 ENCOUNTER — Ambulatory Visit (INDEPENDENT_AMBULATORY_CARE_PROVIDER_SITE_OTHER): Payer: Managed Care, Other (non HMO) | Admitting: Internal Medicine

## 2014-05-04 ENCOUNTER — Encounter: Payer: Self-pay | Admitting: Internal Medicine

## 2014-05-04 VITALS — BP 134/90 | HR 90 | Temp 98.4°F | Ht 65.0 in | Wt 183.8 lb

## 2014-05-04 DIAGNOSIS — N924 Excessive bleeding in the premenopausal period: Secondary | ICD-10-CM

## 2014-05-04 DIAGNOSIS — D649 Anemia, unspecified: Secondary | ICD-10-CM

## 2014-05-04 DIAGNOSIS — K921 Melena: Secondary | ICD-10-CM

## 2014-05-04 DIAGNOSIS — IMO0001 Reserved for inherently not codable concepts without codable children: Secondary | ICD-10-CM

## 2014-05-04 DIAGNOSIS — K5909 Other constipation: Secondary | ICD-10-CM
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
## 2014-05-04 NOTE — Patient Instructions (Signed)
I advise you proceed through with the echocardiogram for the reasons I discussed. make sure the gynecologist that you see in [place] has a copy of evaluation.Note for work stay hydrated seek emergent care as needed. For the constipation take 1 capful of MiraLAX over-the-counter generic mixed with water or juice once or twice a day until stools are soft and off and then as needed. If there is continued blood in the stool we may need to get gastroenterology evaluation.If you need a new FMLA form please document the dates he were out an appointment times. For future  Your fatigue should improve significantly once the anemia is stable and her bleeding has decreased.

## 2014-05-14 ENCOUNTER — Other Ambulatory Visit (HOSPITAL_BASED_OUTPATIENT_CLINIC_OR_DEPARTMENT_OTHER): Payer: Managed Care, Other (non HMO) | Admitting: Lab

## 2014-05-14 ENCOUNTER — Encounter: Payer: Self-pay | Admitting: Hematology & Oncology

## 2014-05-14 ENCOUNTER — Ambulatory Visit (HOSPITAL_BASED_OUTPATIENT_CLINIC_OR_DEPARTMENT_OTHER): Payer: Managed Care, Other (non HMO)

## 2014-05-14 ENCOUNTER — Ambulatory Visit (HOSPITAL_BASED_OUTPATIENT_CLINIC_OR_DEPARTMENT_OTHER): Payer: Managed Care, Other (non HMO) | Admitting: Hematology & Oncology

## 2014-05-14 VITALS — BP 116/76 | HR 108 | Temp 98.8°F | Resp 14 | Ht 65.0 in | Wt 184.0 lb

## 2014-05-14 DIAGNOSIS — D509 Iron deficiency anemia, unspecified: Secondary | ICD-10-CM

## 2014-05-14 LAB — CBC WITH DIFFERENTIAL (CANCER CENTER ONLY)
BASO#: 0 10*3/uL (ref 0.0–0.2)
BASO%: 0.3 % (ref 0.0–2.0)
EOS ABS: 0.2 10*3/uL (ref 0.0–0.5)
EOS%: 3.3 % (ref 0.0–7.0)
HEMATOCRIT: 31.9 % — AB (ref 34.8–46.6)
HEMOGLOBIN: 9.4 g/dL — AB (ref 11.6–15.9)
LYMPH#: 1.4 10*3/uL (ref 0.9–3.3)
LYMPH%: 22.3 % (ref 14.0–48.0)
MCH: 19.7 pg — AB (ref 26.0–34.0)
MCHC: 29.5 g/dL — AB (ref 32.0–36.0)
MCV: 67 fL — ABNORMAL LOW (ref 81–101)
MONO#: 0.6 10*3/uL (ref 0.1–0.9)
MONO%: 9 % (ref 0.0–13.0)
NEUT#: 4.1 10*3/uL (ref 1.5–6.5)
NEUT%: 65.1 % (ref 39.6–80.0)
Platelets: 490 10*3/uL — ABNORMAL HIGH (ref 145–400)
RBC: 4.76 10*6/uL (ref 3.70–5.32)
RDW: 25.1 % — ABNORMAL HIGH (ref 11.1–15.7)
WBC: 6.3 10*3/uL (ref 3.9–10.0)

## 2014-05-14 LAB — RETICULOCYTES (CHCC)
ABS Retic: 48.7 10*3/uL (ref 19.0–186.0)
RBC.: 4.87 MIL/uL (ref 3.87–5.11)
Retic Ct Pct: 1 % (ref 0.4–2.3)

## 2014-05-14 LAB — CHCC SATELLITE - SMEAR

## 2014-05-14 MED ORDER — SODIUM CHLORIDE 0.9 % IV SOLN
Freq: Once | INTRAVENOUS | Status: AC
  Administered 2014-05-14: 15:00:00 via INTRAVENOUS

## 2014-05-14 MED ORDER — SODIUM CHLORIDE 0.9 % IV SOLN
510.0000 mg | Freq: Once | INTRAVENOUS | Status: AC
  Administered 2014-05-14: 510 mg via INTRAVENOUS
  Filled 2014-05-14: qty 17

## 2014-05-14 NOTE — Progress Notes (Signed)
Hematology and Oncology Follow Up Visit  LENNYN GANGE [id number] 23-Aug-1975 39 y.o. 05/14/2014   Principle Diagnosis:   Iron deficiency anemia    Menometrorrhagia  Current Therapy:    IV iron as indicated     Interim History:  Ms.  Tandy is back for follow-up. She is feeling a little better. She still having heavy cycles. She says she's had a menstrual cycle now for 2 weeks. She said that she was given some type of hormone to stop her cycles but this is done the opposite.  We first saw her back in early general, her ferritin was only 5. Her iron saturation was 3%.    she got IV iron. She did well with this.  She feels less short of breath. She still has some palpitations.  She's had no rashes. She's had no abdominal pain. She's had no change in bowel or bladder habits.  She is still working. She's not having any problems with work. She has a little bit more stamina.  Medications:  Current outpatient prescriptions:  .  Cholecalciferol (VITAMIN D) 2000 UNITS tablet, Take 2,000 Units by mouth daily.  , Disp: , Rfl:  .  ferrous sulfate 325 (65 FE) MG tablet, Take 1 tablet (325 mg total) by mouth 3 (three) times daily with meals., Disp: 90 tablet, Rfl: 0 .  ibuprofen (ADVIL,MOTRIN) 800 MG tablet, Take 1 tablet (800 mg total) by mouth every 8 (eight) hours as needed., Disp: 40 tablet, Rfl: 0 .  Probiotic Product (PROBIOTIC + OMEGA-3 PO), Take by mouth every morning., Disp: , Rfl:  .  vitamin C (ASCORBIC ACID) 500 MG tablet, Take 500 mg by mouth 2 (two) times daily., Disp: , Rfl:   Allergies:  Allergies  Allergen Reactions  . Duloxetine     REACTION: diarrhea  . Penicillins     Past Medical History, Surgical history, Social history, and Family History were reviewed and updated.  Review of Systems: As above  Physical Exam:  height is 5' 5"  (1.651 m) and weight is 184 lb (83.462 kg). Her oral temperature is 98.8 F (37.1 C). Her blood pressure is 116/76 and her pulse  is 108. Her respiration is 14.   Well developed and well nourished African American female. Head and neck exam shows no scleral icterus. She has no ocular or oral lesions. She has no palpable cervical or supraclavicular lymph nodes. Lungs are clear bilaterally. Cardiac exam regular rate and rhythm with no murmurs, rubs or bruits. Abdomen is soft. She has good bowel sounds. There is no fluid wave. There is no palpable liver or spleen tip. Back exam shows no tenderness over the spine, ribs or hips. Extremities shows no clubbing, cyanosis or edema. She has good strength in her joints. She has good range of motion. Skin exam shows no rashes, ecchymoses or petechia.  Lab Results  Component Value Date   WBC 6.3 05/14/2014   HGB 9.4* 05/14/2014   HCT 31.9* 05/14/2014   MCV 67* 05/14/2014   PLT 490* 05/14/2014     Chemistry      Component Value Date/Time   NA 141 03/29/2014 1705   K 3.9 03/29/2014 1705   CL 108 03/29/2014 1705   CO2 21 03/29/2014 1705   BUN 9 03/29/2014 1705   CREATININE 0.8 03/29/2014 1705      Component Value Date/Time   CALCIUM 9.5 03/29/2014 1705   ALKPHOS 78 03/29/2014 1705   AST 21 03/29/2014 1705   ALT 9 03/29/2014  1705   BILITOT 0.1* 03/29/2014 1705         Impression and Plan: Ms. Hanisch is a 39 year old Afro-American female. She has iron deficiency anemia. She has menometrorrhagia . It sounds like she may be headed towards some type of procedure to try to help with her monthly cycles.  Her parameters are better. However, sure that she is still iron deficient.  Of note, we did check her for alpha thalassemia. She was negative for any mutation.  We will plan to get her back in another 6 weeks. Hopefully, she will be continuing to improve her blood count and feeling better.  Dierdre Searles, MD 2/19/20162:43 PM

## 2014-05-14 NOTE — Patient Instructions (Signed)

## 2014-05-17 LAB — IRON AND TIBC CHCC
%SAT: 5 % — ABNORMAL LOW (ref 21–57)
IRON: 17 ug/dL — AB (ref 41–142)
TIBC: 344 ug/dL (ref 236–444)
UIBC: 326 ug/dL (ref 120–384)

## 2014-05-17 LAB — FERRITIN CHCC: Ferritin: 55 ng/ml (ref 9–269)

## 2014-05-26 ENCOUNTER — Telehealth: Payer: Self-pay | Admitting: Internal Medicine

## 2014-05-26 NOTE — Telephone Encounter (Signed)
Pt states she would like referral to get an ultrasound of her upper stomach.  Pt states it hurts when she eats and drinks. Pt is also bloated. Pt states she mentioned this issue to dr Regis Bill at last visit. 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  Also, wanted you to know pt came off the hormones and has contined to bleed ,now its been 3 weeks that she has been on her period,

## 2014-05-27 ENCOUNTER — Telehealth: Payer: Self-pay | Admitting: Internal Medicine

## 2014-05-27 DIAGNOSIS — R110 Nausea: Secondary | ICD-10-CM

## 2014-05-27 DIAGNOSIS — R1030 Lower abdominal pain, unspecified: Secondary | ICD-10-CM

## 2014-05-27 NOTE — Telephone Encounter (Signed)
Please have clinical person get more information  Fever vomiting etc  But it seems that   We need to have you see GI  Please refer to area of her choice  asap otherwise  In the short run take otc   prilosec 20 mg 1 po qd   In th interim and or OV

## 2014-05-27 NOTE — Telephone Encounter (Signed)
Called and spoke to patient about her symptoms. Patient states "intermittent nausea with sharp, lower abdominal cramping." Patient also testified abdominal distention and generalized tenderness. Patient confirmed symptoms did not start until 2 weeks ago when she started taking her iron pills. Followed up with MD Yong Channel and he advised to schedule patient to see MD Panosh tomorrow morning where available and go to urgent care/ED if becomes more symptomatic and/or new onset of chest pain, fatigue, or shortness of breath. Called patient back and patient requested to not schedule appointment and is "really wanting to just get an ultra sound done." Patient understood symptoms to look out for if needing to go to urgent care/ED and says she feels fine to wait for MD Panosh or Misty to call back tomorrow about potential GI referral.

## 2014-05-27 NOTE — Telephone Encounter (Signed)
Have the pt talk to triage per [facility] request.  Then send message back to me.  I will wait on note from triage and will then do referral.  Thanks!

## 2014-05-28 NOTE — Telephone Encounter (Signed)
Refer to GI asap. OV   needed to plan any imaging study .   Can stop the iron in the i nterim

## 2014-05-28 NOTE — Telephone Encounter (Signed)
Spoke to the pt.  She has agreed to the referral to GI.  Is requesting a physician at [facility].  Order placed in the system.  She will wait to see if gastro orders any imaging.  If not then will come talk to [facility]  She has an appt with a new gynecologist on Monday to discuss her low iron level.  Currently doing IV iron.  She is not taking any oral supplements.  Advised she call back and speak to [facility] if needed.

## 2014-06-02 ENCOUNTER — Telehealth: Payer: Self-pay | Admitting: Internal Medicine

## 2014-06-02 NOTE — Telephone Encounter (Signed)
Yes, informed pt she would have to fax or bring her copy by. It has not got to scan yet

## 2014-06-02 NOTE — Telephone Encounter (Signed)
There is no copy in the chart.

## 2014-06-02 NOTE — Telephone Encounter (Signed)
On question 5 of pt's fmla form., it states: Will employee need to work parttime or reduced schedule due to this condition? Dr Regis Bill said "yes: Now they state that in incorrect.  Pt is home today w/ her daughter and cannot fax in , but states they need this back 3/11. Do you have a copy?

## 2014-06-03 ENCOUNTER — Telehealth: Payer: Self-pay | Admitting: Internal Medicine

## 2014-06-03 ENCOUNTER — Telehealth: Payer: Self-pay | Admitting: *Deleted

## 2014-06-03 DIAGNOSIS — R109 Unspecified abdominal pain: Secondary | ICD-10-CM

## 2014-06-03 DIAGNOSIS — D649 Anemia, unspecified: Secondary | ICD-10-CM

## 2014-06-03 NOTE — Telephone Encounter (Signed)
Patient c/o abdominal discomfort and bloating. Wants to know if it from her IV iron infusions. She is requesting an abdominal US.  Spoke to Dr Marin Olp who doesn't feel the abdominal problems are r/t her iron. Possible connection to her menstrual problems and hormonal treatments. Dr Marin Olp doesn't feel like the [place] would be beneficial, but feels like if the patient would still like to follow through with one, he will order it.   Patient is going to call her PCP and get their thoughts, but will call us back to order [place] if she wants.

## 2014-06-03 NOTE — Telephone Encounter (Signed)
[name] has requested to be referred to [facility] in [place], [place].

## 2014-06-04 NOTE — Telephone Encounter (Signed)
Please do  . Want her to be seen sooner rather than later .  Anemia sever and abd pain

## 2014-06-07 NOTE — Telephone Encounter (Signed)
Referral ordered

## 2014-06-25 ENCOUNTER — Other Ambulatory Visit (HOSPITAL_BASED_OUTPATIENT_CLINIC_OR_DEPARTMENT_OTHER): Payer: Managed Care, Other (non HMO)

## 2014-06-25 ENCOUNTER — Encounter: Payer: Self-pay | Admitting: Family

## 2014-06-25 ENCOUNTER — Ambulatory Visit: Payer: Managed Care, Other (non HMO)

## 2014-06-25 ENCOUNTER — Ambulatory Visit (HOSPITAL_BASED_OUTPATIENT_CLINIC_OR_DEPARTMENT_OTHER): Payer: Managed Care, Other (non HMO) | Admitting: Family

## 2014-06-25 VITALS — BP 135/80 | HR 95 | Temp 97.8°F | Resp 14 | Ht 66.0 in | Wt 183.0 lb

## 2014-06-25 DIAGNOSIS — D500 Iron deficiency anemia secondary to blood loss (chronic): Secondary | ICD-10-CM

## 2014-06-25 DIAGNOSIS — D509 Iron deficiency anemia, unspecified: Secondary | ICD-10-CM

## 2014-06-25 LAB — CBC WITH DIFFERENTIAL (CANCER CENTER ONLY)
BASO#: 0 10*3/uL (ref 0.0–0.2)
BASO%: 0.2 % (ref 0.0–2.0)
EOS ABS: 0.2 10*3/uL (ref 0.0–0.5)
EOS%: 2 % (ref 0.0–7.0)
HCT: 33 % — ABNORMAL LOW (ref 34.8–46.6)
HEMOGLOBIN: 10.2 g/dL — AB (ref 11.6–15.9)
LYMPH#: 2 10*3/uL (ref 0.9–3.3)
LYMPH%: 18.3 % (ref 14.0–48.0)
MCH: 21.7 pg — ABNORMAL LOW (ref 26.0–34.0)
MCHC: 30.9 g/dL — AB (ref 32.0–36.0)
MCV: 70 fL — ABNORMAL LOW (ref 81–101)
MONO#: 0.9 10*3/uL (ref 0.1–0.9)
MONO%: 7.9 % (ref 0.0–13.0)
NEUT#: 7.7 10*3/uL — ABNORMAL HIGH (ref 1.5–6.5)
NEUT%: 71.6 % (ref 39.6–80.0)
Platelets: 479 10*3/uL — ABNORMAL HIGH (ref 145–400)
RBC: 4.69 10*6/uL (ref 3.70–5.32)
RDW: 16 % — ABNORMAL HIGH (ref 11.1–15.7)
WBC: 10.8 10*3/uL — AB (ref 3.9–10.0)

## 2014-06-25 NOTE — Progress Notes (Signed)
Hematology and Oncology Follow Up Visit  [name] [id number] [birth year] 39 y.o. 06/25/2014   Principle Diagnosis:  Iron deficiency anemia secondary to menometrorrhagia   Current Therapy:   IV iron as indicated     Interim History:  [name] is here today for a follow-up. She is feeling very tired. The lab and nurses both had a difficult time obtaining blood from her today. She would like to some back in next week for iron. This is fine.  She is still having heavy cycles. She has an appointment with her gynecologist to discuss having her uterus removed due to fibroid tumors. She also has gallstones and needs her gallbladder removed. This has been stressful for her.  She denies fever, chills, n/v, cough, rash, dizziness, chest pain, palpitations, abdominal pain, constipation, diarrhea, blood in urine or stool. She does have some SOB with exertion.  No swelling, tenderness, numbness or tingling in her extremities. No new aches or pains.   Her appetite is good and she is staying hydrated.  Her last iron infusion was in February. Her iron saturation at that time was 5% and ferritin was 55.   Medications:    Medication List       This list is accurate as of: 06/25/14  2:41 PM.  Always use your most recent med list.               acetaminophen 500 MG tablet  Commonly known as:  TYLENOL  Take 500 mg by mouth.     acetaminophen-codeine 300-30 MG per tablet  Commonly known as:  TYLENOL #3  Take 1 tablet by mouth.     dicyclomine 10 MG capsule  Commonly known as:  BENTYL  Take 10 mg by mouth.     Folic Acid 5 MG Caps  Take by mouth every morning.     omeprazole 10 MG capsule  Commonly known as:  PRILOSEC  Take 10 mg by mouth 2 (two) times daily.     PROBIOTIC + OMEGA-3 PO  Take by mouth every morning.     Vitamin B 12 100 MCG Lozg  Take by mouth every morning.     vitamin C 500 MG tablet  Commonly known as:  ASCORBIC ACID  Take 500 mg by mouth 2 (two) times daily.       Vitamin D 2000 UNITS tablet  Take 2,000 Units by mouth daily.        Allergies:  Allergies  Allergen Reactions  . Penicillins Hives and Shortness Of Breath  . Duloxetine     REACTION: diarrhea    Past Medical History, Surgical history, Social history, and Family History were reviewed and updated.  Review of Systems: All other 10 point review of systems is negative.   Physical Exam:  height is 5' 6"  (1.676 m) and weight is 183 lb (83.008 kg). Her oral temperature is 97.8 F (36.6 C). Her blood pressure is 135/80 and her pulse is 95. Her respiration is 14.   Wt Readings from Last 3 Encounters:  06/25/14 183 lb (83.008 kg)  05/14/14 184 lb (83.462 kg)  05/04/14 183 lb 12.8 oz (83.371 kg)    Ocular: Sclerae unicteric, pupils equal, round and reactive to light Ear-nose-throat: Oropharynx clear, dentition fair Lymphatic: No cervical or supraclavicular adenopathy Lungs no rales or rhonchi, good excursion bilaterally Heart regular rate and rhythm, no murmur appreciated Abd soft, nontender, positive bowel sounds MSK no focal spinal tenderness, no joint edema Neuro: non-focal, well-oriented, appropriate  affect Breasts: Deferred  Lab Results  Component Value Date   WBC 6.3 05/14/2014   HGB 9.4* 05/14/2014   HCT 31.9* 05/14/2014   MCV 67* 05/14/2014   PLT 490* 05/14/2014   Lab Results  Component Value Date   FERRITIN 55 05/14/2014   IRON 17* 05/14/2014   TIBC 344 05/14/2014   UIBC 326 05/14/2014   IRONPCTSAT 5* 05/14/2014   Lab Results  Component Value Date   RETICCTPCT 1.0 05/14/2014   RBC 4.76 05/14/2014   RBC 4.87 05/14/2014   RETICCTABS 48.7 05/14/2014   No results found for: KPAFRELGTCHN, LAMBDASER, KAPLAMBRATIO No results found for: IGGSERUM, IGA, IGMSERUM No results found for: Odetta Pink, SPEI   Chemistry      Component Value Date/Time   NA 141 03/29/2014 1705   K 3.9 03/29/2014 1705   CL  108 03/29/2014 1705   CO2 21 03/29/2014 1705   BUN 9 03/29/2014 1705   CREATININE 0.8 03/29/2014 1705      Component Value Date/Time   CALCIUM 9.5 03/29/2014 1705   ALKPHOS 78 03/29/2014 1705   AST 21 03/29/2014 1705   ALT 9 03/29/2014 1705   BILITOT 0.1* 03/29/2014 1705     Impression and Plan: [name] is a 39 year old Afro-American female with iron deficiency anemia secondary to menometrorrhagia. She is symptomatic with SOB and fatigue.  She would like to come in next week for iron instead of today. We will see her on Wednesday at 3.  She has an appointment to speak with her gynecologist about having a partial hysterectomy.   We will see her in 6 weeks for follow-up and labs.  She knows to call here with any questions or concerns. We can certainly see her sooner if need be.   Eliezer Bottom, NP 4/1/20162:41 PM

## 2014-06-28 LAB — IRON AND TIBC CHCC
%SAT: 9 % — ABNORMAL LOW (ref 21–57)
Iron: 35 ug/dL — ABNORMAL LOW (ref 41–142)
TIBC: 370 ug/dL (ref 236–444)
UIBC: 335 ug/dL (ref 120–384)

## 2014-06-28 LAB — FERRITIN CHCC: Ferritin: 44 ng/ml (ref 9–269)

## 2014-06-30 ENCOUNTER — Ambulatory Visit: Payer: Managed Care, Other (non HMO)

## 2014-07-01 ENCOUNTER — Encounter: Payer: Self-pay | Admitting: Internal Medicine

## 2014-07-21 ENCOUNTER — Encounter: Payer: Self-pay | Admitting: Internal Medicine

## 2014-07-21 ENCOUNTER — Ambulatory Visit (INDEPENDENT_AMBULATORY_CARE_PROVIDER_SITE_OTHER): Payer: Managed Care, Other (non HMO) | Admitting: Internal Medicine

## 2014-07-21 VITALS — BP 126/82 | Temp 97.9°F | Ht 65.0 in | Wt 182.5 lb

## 2014-07-21 DIAGNOSIS — N924 Excessive bleeding in the premenopausal period: Secondary | ICD-10-CM

## 2014-07-21 DIAGNOSIS — M797 Fibromyalgia: Secondary | ICD-10-CM

## 2014-07-21 DIAGNOSIS — K8020 Calculus of gallbladder without cholecystitis without obstruction: Secondary | ICD-10-CM

## 2014-07-21 DIAGNOSIS — D649 Anemia, unspecified: Secondary | ICD-10-CM | POA: Diagnosis not present

## 2014-07-21 DIAGNOSIS — K921 Melena: Secondary | ICD-10-CM | POA: Diagnosis not present

## 2014-07-21 DIAGNOSIS — IMO0001 Reserved for inherently not codable concepts without codable children: Secondary | ICD-10-CM

## 2014-07-21 NOTE — Patient Instructions (Addendum)
Get [place] copy of  Ultrasound report . And make sure specialist copy [place] still. Will fill out fmla form .  surgery for gall bladder  and hysterectomy makes sense, based on your history .  Ask you specialist  also . About the gi issues .  On going .
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
## 2014-07-24 DIAGNOSIS — K8020 Calculus of gallbladder without cholecystitis without obstruction: Secondary | ICD-10-CM | POA: Insufficient documentation

## 2014-07-27 DIAGNOSIS — D252 Subserosal leiomyoma of uterus: Secondary | ICD-10-CM | POA: Insufficient documentation

## 2014-07-27 HISTORY — DX: Subserosal leiomyoma of uterus: D25.2

## 2014-08-05 ENCOUNTER — Ambulatory Visit: Payer: Managed Care, Other (non HMO) | Admitting: Hematology & Oncology

## 2014-08-05 ENCOUNTER — Other Ambulatory Visit: Payer: Managed Care, Other (non HMO)

## 2014-08-13 ENCOUNTER — Telehealth: Payer: Self-pay | Admitting: Internal Medicine

## 2014-08-13 NOTE — Telephone Encounter (Signed)
I dont know what this means.  She has a number of medical issues  That should qualified for dec hours  but if needs to take off for gallbladder surgery  documentaion should come from GI or the surgeon .

## 2014-08-13 NOTE — Telephone Encounter (Signed)
LM for the pt to return my call.

## 2014-08-13 NOTE — Telephone Encounter (Signed)
Pt received letter from her FMLA for her gallstones and it was denied. Pt forgot to bring the letter to work, but thinks it was due  to not enough supporting evidence. Pt would like to know if you could redo.  Pt will fax the letter to dr Regis Bill on monday

## 2014-08-17 NOTE — Telephone Encounter (Signed)
LM for the pt to return my call.

## 2014-08-18 LAB — HM COLONOSCOPY

## 2014-08-18 NOTE — Telephone Encounter (Signed)
LM for the pt to return my call.  Have made multiple attempts to reach her.  Will now close the message.

## 2014-08-26 LAB — HM MAMMOGRAPHY: HM MAMMO: NORMAL

## 2014-08-30 ENCOUNTER — Encounter: Payer: Self-pay | Admitting: Family Medicine

## 2014-09-01 ENCOUNTER — Encounter: Payer: Self-pay | Admitting: Family Medicine

## 2014-09-13 ENCOUNTER — Encounter: Payer: Self-pay | Admitting: Internal Medicine

## 2014-10-04 ENCOUNTER — Telehealth: Payer: Self-pay | Admitting: Internal Medicine

## 2014-10-04 NOTE — Telephone Encounter (Signed)
Pt decline appt at this time and will callback if needed

## 2014-10-04 NOTE — Telephone Encounter (Signed)
PLEASE NOTE: All timestamps contained within this report are represented as [place] Standard Time. CONFIDENTIALTY NOTICE: This fax transmission is intended only for the addressee. It contains information that is legally privileged, confidential or otherwise protected from use or disclosure. If you are not the intended recipient, you are strictly prohibited from reviewing, disclosing, copying using or disseminating any of this information or taking any action in reliance on or regarding this information. If you have received this fax in error, please notify us immediately by telephone so that we can arrange for its return to [place]. Phone: [phone], Toll-Free: [phone], Fax: [phone] Page: 1 of 1 Call Id: [id number] [facility] [place] Patient Name: [name] DOB: [birth year] Initial Comment caller states her grandmother is in hospice and she is having a hard time coping, she is crying and upset Nurse Assessment Guidelines Guideline Title Affirmed Question Affirmed Notes Final Disposition User Clinical Call [place], RN, Amy Comments CALLER STATES THAT HER GRAND-MOTHER JUST GOT PUT ON HOSPICE TODAY AND SHE IS NOT DOING GOOD. SHE DOES NOT WANT TO LEAVE HER GRAND-MOTHER RIGHT NOW FOR AN APPT. SHE IS REQUESTING THAT DR. PANOSH PRESCRIBE HER SOMETHING FOR HER NERVES TO HELP HER DEAL WITH THIS SITUATION. INFORMED HER THAT TRIAGE NURSE WOULD SEND OVER THE REQUEST FOR HER.

## 2014-10-04 NOTE — Telephone Encounter (Signed)
Will have to be seen in appt to get medication.  Please schedule.  Thanks!

## 2014-10-20 ENCOUNTER — Telehealth: Payer: Self-pay | Admitting: Internal Medicine

## 2014-10-20 NOTE — Telephone Encounter (Signed)
Call pt and told her that [facility] said she needed to contact hematology doctor

## 2014-10-20 NOTE — Telephone Encounter (Signed)
Pt call to say that she is not feeling all that well and would like to know if she can come in and have her blood count check.

## 2014-10-20 NOTE — Telephone Encounter (Signed)
This needs to be triaged.  I do not know what "not feeling all that well" means.  Please have her speak to [facility].

## 2014-12-24 ENCOUNTER — Encounter: Payer: Self-pay | Admitting: Internal Medicine

## 2014-12-24 ENCOUNTER — Ambulatory Visit: Payer: Managed Care, Other (non HMO) | Admitting: Internal Medicine

## 2014-12-24 ENCOUNTER — Ambulatory Visit (INDEPENDENT_AMBULATORY_CARE_PROVIDER_SITE_OTHER): Payer: Managed Care, Other (non HMO) | Admitting: Internal Medicine

## 2014-12-24 VITALS — BP 130/80 | HR 64 | Temp 98.6°F | Wt 157.0 lb

## 2014-12-24 DIAGNOSIS — J0100 Acute maxillary sinusitis, unspecified: Secondary | ICD-10-CM

## 2014-12-24 DIAGNOSIS — D509 Iron deficiency anemia, unspecified: Secondary | ICD-10-CM | POA: Diagnosis not present

## 2014-12-24 DIAGNOSIS — K8020 Calculus of gallbladder without cholecystitis without obstruction: Secondary | ICD-10-CM

## 2014-12-24 HISTORY — DX: Acute maxillary sinusitis, unspecified: J01.00

## 2014-12-24 LAB — CBC WITH DIFFERENTIAL/PLATELET
HEMATOCRIT: 27.1 % — AB (ref 36.0–46.0)
Hemoglobin: 8.3 g/dL — ABNORMAL LOW (ref 12.0–15.0)
MCHC: 30.4 g/dL (ref 30.0–36.0)
MCV: 54.5 fl — ABNORMAL LOW (ref 78.0–100.0)
Platelets: 691 10*3/uL — ABNORMAL HIGH (ref 150.0–400.0)
RBC: 4.97 Mil/uL (ref 3.87–5.11)
RDW: 20.5 % — AB (ref 11.5–15.5)
WBC: 8.4 10*3/uL (ref 4.0–10.5)

## 2014-12-24 MED ORDER — PREDNISONE 20 MG PO TABS: ORAL_TABLET | ORAL | Status: DC

## 2014-12-24 MED ORDER — DOXYCYCLINE HYCLATE 100 MG PO TABS: 100.0000 mg | ORAL_TABLET | Freq: Two times a day (BID) | ORAL | Status: DC

## 2014-12-24 NOTE — Patient Instructions (Addendum)
Allergy may be contributing to your sinusitis also . And antibiotic and  Prednisone  Take with plenty of  Water and fluids .  Saline nose spray decongestant as tolerated expect improvement in 3-5 day s Will notify you  of labs when available. Make appt with hematology .  Have your specilsit refill t # 3   Since they wrote the original rx .  Stay on prilosec .
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
## 2014-12-24 NOTE — Progress Notes (Signed)
Pre visit review using our clinic review tool, if applicable. No additional management support is needed unless otherwise documented below in the visit note. Influenza immunization was not given due to patient is ill.

## 2014-12-27 ENCOUNTER — Emergency Department (HOSPITAL_BASED_OUTPATIENT_CLINIC_OR_DEPARTMENT_OTHER)
Admission: EM | Admit: 2014-12-27 | Discharge: 2014-12-27 | Disposition: A | Discharge status: Home / Self Care | Payer: Managed Care, Other (non HMO) | Attending: Emergency Medicine | Admitting: Emergency Medicine

## 2014-12-27 ENCOUNTER — Encounter (HOSPITAL_BASED_OUTPATIENT_CLINIC_OR_DEPARTMENT_OTHER): Payer: Self-pay

## 2014-12-27 ENCOUNTER — Emergency Department (HOSPITAL_BASED_OUTPATIENT_CLINIC_OR_DEPARTMENT_OTHER): Payer: Managed Care, Other (non HMO)

## 2014-12-27 DIAGNOSIS — Z8639 Personal history of other endocrine, nutritional and metabolic disease: Secondary | ICD-10-CM | POA: Diagnosis not present

## 2014-12-27 DIAGNOSIS — R61 Generalized hyperhidrosis: Secondary | ICD-10-CM | POA: Diagnosis not present

## 2014-12-27 DIAGNOSIS — Z79899 Other long term (current) drug therapy: Secondary | ICD-10-CM | POA: Diagnosis not present

## 2014-12-27 DIAGNOSIS — K8020 Calculus of gallbladder without cholecystitis without obstruction: Secondary | ICD-10-CM | POA: Diagnosis not present

## 2014-12-27 DIAGNOSIS — Z8739 Personal history of other diseases of the musculoskeletal system and connective tissue: Secondary | ICD-10-CM | POA: Insufficient documentation

## 2014-12-27 DIAGNOSIS — R231 Pallor: Secondary | ICD-10-CM | POA: Insufficient documentation

## 2014-12-27 DIAGNOSIS — Z862 Personal history of diseases of the blood and blood-forming organs and certain disorders involving the immune mechanism: Secondary | ICD-10-CM | POA: Insufficient documentation

## 2014-12-27 DIAGNOSIS — Z880 Allergy status to penicillin: Secondary | ICD-10-CM | POA: Insufficient documentation

## 2014-12-27 DIAGNOSIS — R1011 Right upper quadrant pain: Secondary | ICD-10-CM | POA: Diagnosis present

## 2014-12-27 DIAGNOSIS — M1990 Unspecified osteoarthritis, unspecified site: Secondary | ICD-10-CM | POA: Diagnosis not present

## 2014-12-27 HISTORY — DX: Calculus of gallbladder without cholecystitis without obstruction: K80.20

## 2014-12-27 LAB — COMPREHENSIVE METABOLIC PANEL
ALK PHOS: 79 U/L (ref 38–126)
ALT: 9 U/L — AB (ref 14–54)
AST: 14 U/L — ABNORMAL LOW (ref 15–41)
Albumin: 4 g/dL (ref 3.5–5.0)
Anion gap: 11 (ref 5–15)
BILIRUBIN TOTAL: 0.5 mg/dL (ref 0.3–1.2)
BUN: 7 mg/dL (ref 6–20)
CALCIUM: 9.8 mg/dL (ref 8.9–10.3)
CO2: 26 mmol/L (ref 22–32)
CREATININE: 0.66 mg/dL (ref 0.44–1.00)
Chloride: 101 mmol/L (ref 101–111)
Glucose, Bld: 131 mg/dL — ABNORMAL HIGH (ref 65–99)
Potassium: 3.4 mmol/L — ABNORMAL LOW (ref 3.5–5.1)
Sodium: 138 mmol/L (ref 135–145)
TOTAL PROTEIN: 8.5 g/dL — AB (ref 6.5–8.1)

## 2014-12-27 LAB — CBC WITH DIFFERENTIAL/PLATELET
Basophils Absolute: 0 10*3/uL (ref 0.0–0.1)
Basophils Relative: 0 %
EOS PCT: 0 %
Eosinophils Absolute: 0 10*3/uL (ref 0.0–0.7)
HCT: 27.5 % — ABNORMAL LOW (ref 36.0–46.0)
Hemoglobin: 7.9 g/dL — ABNORMAL LOW (ref 12.0–15.0)
LYMPHS ABS: 0.7 10*3/uL (ref 0.7–4.0)
Lymphocytes Relative: 14 %
MCH: 15.5 pg — AB (ref 26.0–34.0)
MCHC: 28.7 g/dL — ABNORMAL LOW (ref 30.0–36.0)
MCV: 53.8 fL — AB (ref 78.0–100.0)
MONOS PCT: 1 %
Monocytes Absolute: 0.1 10*3/uL (ref 0.1–1.0)
Neutro Abs: 4.1 10*3/uL (ref 1.7–7.7)
Neutrophils Relative %: 85 %
PLATELETS: 909 10*3/uL — AB (ref 150–400)
RBC: 5.11 MIL/uL (ref 3.87–5.11)
RDW: 20.9 % — ABNORMAL HIGH (ref 11.5–15.5)
WBC: 4.9 10*3/uL (ref 4.0–10.5)

## 2014-12-27 LAB — LIPASE, BLOOD: LIPASE: 20 U/L — AB (ref 22–51)

## 2014-12-27 MED ORDER — ONDANSETRON 4 MG PO TBDP: 4.0000 mg | ORAL_TABLET | Freq: Once | ORAL | Status: DC

## 2014-12-27 MED ORDER — SODIUM CHLORIDE 0.9 % IV BOLUS (SEPSIS): 1000.0000 mL | Freq: Once | INTRAVENOUS | Status: DC

## 2014-12-27 MED ORDER — HYDROMORPHONE HCL 1 MG/ML IJ SOLN
0.5000 mg | Freq: Once | INTRAMUSCULAR | Status: AC
  Administered 2014-12-27: 0.5 mg via INTRAVENOUS

## 2014-12-27 MED ORDER — SODIUM CHLORIDE 0.9 % IV BOLUS (SEPSIS)
1000.0000 mL | Freq: Once | INTRAVENOUS | Status: AC
  Administered 2014-12-27: 1000 mL via INTRAVENOUS

## 2014-12-27 MED ORDER — HYDROMORPHONE HCL 1 MG/ML IJ SOLN: 1.0000 mg | Freq: Once | INTRAMUSCULAR | Status: DC

## 2014-12-27 MED ORDER — ONDANSETRON HCL 4 MG PO TABS: 4.0000 mg | ORAL_TABLET | Freq: Four times a day (QID) | ORAL | Status: DC

## 2014-12-27 MED ORDER — ONDANSETRON HCL 4 MG/2ML IJ SOLN
4.0000 mg | Freq: Once | INTRAMUSCULAR | Status: DC
  Filled 2014-12-27: qty 2

## 2014-12-27 MED ORDER — HYDROCODONE-ACETAMINOPHEN 5-325 MG PO TABS: 1.0000 | ORAL_TABLET | Freq: Four times a day (QID) | ORAL | Status: DC | PRN

## 2014-12-27 MED ORDER — ONDANSETRON HCL 4 MG/2ML IJ SOLN
4.0000 mg | Freq: Once | INTRAMUSCULAR | Status: AC
  Administered 2014-12-27: 4 mg via INTRAVENOUS

## 2014-12-27 MED ORDER — HYDROMORPHONE HCL 1 MG/ML IJ SOLN
0.5000 mg | Freq: Once | INTRAMUSCULAR | Status: AC
  Administered 2014-12-27: 0.5 mg via INTRAVENOUS
  Filled 2014-12-27: qty 1

## 2014-12-27 MED ORDER — HYDROMORPHONE HCL 1 MG/ML IJ SOLN
0.5000 mg | Freq: Once | INTRAMUSCULAR | Status: DC
  Filled 2014-12-27: qty 1

## 2014-12-27 NOTE — ED Notes (Signed)
RUQ pain x 24 hours-recent dx of gallstones

## 2014-12-27 NOTE — ED Notes (Signed)
Patient transported to Ultrasound 

## 2014-12-27 NOTE — Discharge Instructions (Signed)
Biliary Colic  Biliary colic is a steady or irregular pain in the upper abdomen. It is usually under the right side of the rib cage. It happens when gallstones interfere with the normal flow of bile from the gallbladder. Bile is a liquid that helps to digest fats. Bile is made in the liver and stored in the gallbladder. When you eat a meal, bile passes from the gallbladder through the cystic duct and the common bile duct into the small intestine. There, it mixes with partially digested food. If a gallstone blocks either of these ducts, the normal flow of bile is blocked. The muscle cells in the bile duct contract forcefully to try to move the stone. This causes the pain of biliary colic.  SYMPTOMS   A person with biliary colic usually complains of pain in the upper abdomen. This pain can be:  In the center of the upper abdomen just below the breastbone.  In the upper-right part of the abdomen, near the gallbladder and liver.  Spread back toward the right shoulder blade.  Nausea and vomiting.  The pain usually occurs after eating.  Biliary colic is usually triggered by the digestive system's demand for bile. The demand for bile is high after fatty meals. Symptoms can also occur when a person who has been fasting suddenly eats a very large meal. Most episodes of biliary colic pass after 1 to 5 hours. After the most intense pain passes, your abdomen may continue to ache mildly for about 24 hours. DIAGNOSIS  After you describe your symptoms, your caregiver will perform a physical exam. He or she will pay attention to the upper right portion of your belly (abdomen). This is the area of your liver and gallbladder. An ultrasound will help your caregiver look for gallstones. Specialized scans of the gallbladder may also be done. Blood tests may be done, especially if you have fever or if your pain persists. PREVENTION  Biliary colic can be prevented by controlling the risk factors for gallstones. Some of  these risk factors, such as heredity, increasing age, and pregnancy are a normal part of life. Obesity and a high-fat diet are risk factors you can change through a healthy lifestyle. Women going through menopause who take hormone replacement therapy (estrogen) are also more likely to develop biliary colic. TREATMENT   Pain medication may be prescribed.  You may be encouraged to eat a fat-free diet.  If the first episode of biliary colic is severe, or episodes of colic keep retuning, surgery to remove the gallbladder (cholecystectomy) is usually recommended. This procedure can be done through small incisions using an instrument called a laparoscope. The procedure often requires a brief stay in the hospital. Some people can leave the hospital the same day. It is the most widely used treatment in people troubled by painful gallstones. It is effective and safe, with no complications in more than 90% of cases.  If surgery cannot be done, medication that dissolves gallstones may be used. This medication is expensive and can take months or years to work. Only small stones will dissolve.  Rarely, medication to dissolve gallstones is combined with a procedure called shock-wave lithotripsy. This procedure uses carefully aimed shock waves to break up gallstones. In many people treated with this procedure, gallstones form again within a few years. PROGNOSIS  If gallstones block your cystic duct or common bile duct, you are at risk for repeated episodes of biliary colic. There is also a 25% chance that you will develop  a gallbladder infection(acute cholecystitis), or some other complication of gallstones within 10 to 20 years. If you have surgery, schedule it at a time that is convenient for you and at a time when you are not sick. HOME CARE INSTRUCTIONS   Drink plenty of clear fluids.  Avoid fatty, greasy or fried foods, or any foods that make your pain worse.  Take medications as directed. SEEK MEDICAL  CARE IF:   You develop a fever over 100.5 F (38.1 C).  Your pain gets worse over time.  You develop nausea that prevents you from eating and drinking.  You develop vomiting. SEEK IMMEDIATE MEDICAL CARE IF:   You have continuous or severe belly (abdominal) pain which is not relieved with medications.  You develop nausea and vomiting which is not relieved with medications.  You have symptoms of biliary colic and you suddenly develop a fever and shaking chills. This may signal cholecystitis. Call your caregiver immediately.  You develop a yellow color to your skin or the white part of your eyes (jaundice). Document Released: 08/13/2005 Document Revised: 06/04/2011 Document Reviewed: 10/23/2007 [facility] Patient Information 2015 [place], [place]. This information is not intended to replace advice given to you by your health care provider. Make sure you discuss any questions you have with your health care provider.

## 2014-12-27 NOTE — ED Notes (Signed)
Pt returned from [place], awaiting results

## 2014-12-27 NOTE — ED Notes (Signed)
Pt verbalizes understanding of d/c instructions and denies any further needs at this time. 

## 2014-12-27 NOTE — ED Notes (Signed)
MD at bedside. 
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
## 2014-12-27 NOTE — ED Notes (Signed)
Attempted IV stick without success, deferred to Rod Holler, Therapist, sports for US guided IV.  Pt c/o 24 hours of RUQ pain, hx of gallstones, had two episodes of vomiting earlier today.

## 2014-12-28 LAB — PATHOLOGIST SMEAR REVIEW

## 2015-02-04 ENCOUNTER — Telehealth: Payer: Self-pay | Admitting: Internal Medicine

## 2015-02-04 MED ORDER — FLUCONAZOLE 150 MG PO TABS: 150.0000 mg | ORAL_TABLET | Freq: Once | ORAL | Status: DC

## 2015-02-04 NOTE — Telephone Encounter (Signed)
Tried reaching the pt X 3.  Received a message that "all circuits are busy."  Will try again at a later time.

## 2015-02-04 NOTE — Telephone Encounter (Signed)
Spoke to the pt.  White vaginal discharge/thick and itching.  Denied fever and abdominal pain.  Has tried OTC monistat.  Per WP, okay to call in medication if no fever or abdominal pain.

## 2015-02-04 NOTE — Telephone Encounter (Signed)
Left a message for a return call.  Need further information.  Fever, abd pain, vaginal odor/discharge, itching, burning etc?

## 2015-02-04 NOTE — Telephone Encounter (Signed)
Pt states the  doxycycline (VIBRA-TABS) 100 MG tablet  Dr Regis Bill prescribed for her 9/30 gave her a yeast infection.  She has tried over the counter and  nothing has helped, gotten worse. Would like to know if Dr Regis Bill will call in RX for this yeast infection.   [facility] in [place]

## 2015-02-04 NOTE — Telephone Encounter (Signed)
Pt called back and left a message on my machine.  Asked for a call back on [phone]

## 2015-04-01 ENCOUNTER — Ambulatory Visit (HOSPITAL_BASED_OUTPATIENT_CLINIC_OR_DEPARTMENT_OTHER): Payer: Managed Care, Other (non HMO)

## 2015-04-01 ENCOUNTER — Other Ambulatory Visit (HOSPITAL_BASED_OUTPATIENT_CLINIC_OR_DEPARTMENT_OTHER): Payer: Managed Care, Other (non HMO)

## 2015-04-01 ENCOUNTER — Encounter: Payer: Self-pay | Admitting: Family

## 2015-04-01 ENCOUNTER — Ambulatory Visit (HOSPITAL_BASED_OUTPATIENT_CLINIC_OR_DEPARTMENT_OTHER): Payer: Managed Care, Other (non HMO) | Admitting: Family

## 2015-04-01 VITALS — BP 115/78 | HR 126 | Temp 97.9°F | Resp 16 | Ht 65.0 in | Wt 142.0 lb

## 2015-04-01 DIAGNOSIS — N921 Excessive and frequent menstruation with irregular cycle: Secondary | ICD-10-CM | POA: Diagnosis not present

## 2015-04-01 DIAGNOSIS — D509 Iron deficiency anemia, unspecified: Secondary | ICD-10-CM

## 2015-04-01 DIAGNOSIS — D500 Iron deficiency anemia secondary to blood loss (chronic): Secondary | ICD-10-CM

## 2015-04-01 LAB — FERRITIN: FERRITIN: 202 ng/mL (ref 9–269)

## 2015-04-01 LAB — CBC WITH DIFFERENTIAL (CANCER CENTER ONLY)
BASO#: 0 10*3/uL (ref 0.0–0.2)
BASO%: 0.4 % (ref 0.0–2.0)
EOS%: 1.1 % (ref 0.0–7.0)
Eosinophils Absolute: 0.1 10*3/uL (ref 0.0–0.5)
HEMATOCRIT: 36.5 % (ref 34.8–46.6)
HGB: 11.4 g/dL — ABNORMAL LOW (ref 11.6–15.9)
LYMPH#: 0.9 10*3/uL (ref 0.9–3.3)
LYMPH%: 16.1 % (ref 14.0–48.0)
MCH: 21.3 pg — AB (ref 26.0–34.0)
MCHC: 31.2 g/dL — ABNORMAL LOW (ref 32.0–36.0)
MCV: 68 fL — AB (ref 81–101)
MONO#: 0.5 10*3/uL (ref 0.1–0.9)
MONO%: 9.1 % (ref 0.0–13.0)
NEUT#: 3.9 10*3/uL (ref 1.5–6.5)
NEUT%: 73.3 % (ref 39.6–80.0)
Platelets: 414 10*3/uL — ABNORMAL HIGH (ref 145–400)
RBC: 5.36 10*6/uL — ABNORMAL HIGH (ref 3.70–5.32)
RDW: 15.3 % (ref 11.1–15.7)
WBC: 5.3 10*3/uL (ref 3.9–10.0)

## 2015-04-01 LAB — IRON AND TIBC
%SAT: 22 % (ref 21–57)
IRON: 52 ug/dL (ref 41–142)
TIBC: 234 ug/dL — ABNORMAL LOW (ref 236–444)
UIBC: 182 ug/dL (ref 120–384)

## 2015-04-01 LAB — RETICULOCYTES
ABS RETIC: 26.5 10*3/uL (ref 19.0–186.0)
RBC.: 5.29 MIL/uL — AB (ref 3.87–5.11)
RETIC CT PCT: 0.5 % (ref 0.4–2.3)

## 2015-04-01 MED ORDER — SODIUM CHLORIDE 0.9 % IV SOLN
510.0000 mg | Freq: Once | INTRAVENOUS | Status: AC
  Administered 2015-04-01: 510 mg via INTRAVENOUS
  Filled 2015-04-01: qty 17

## 2015-04-01 MED ORDER — SODIUM CHLORIDE 0.9 % IV SOLN
Freq: Once | INTRAVENOUS | Status: AC
  Administered 2015-04-01: 10:00:00 via INTRAVENOUS

## 2015-04-01 NOTE — Patient Instructions (Signed)

## 2015-04-01 NOTE — Progress Notes (Signed)
Hematology and Oncology Follow Up Visit  [name] [id number] 1975/08/12 40 y.o. 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 She is feeling very fatigued and having SOB with exertion. She has had a couple episodes of dizziness without falls or syncope.  She is scheduled to have a partial hysterectomy and fibroid tumor removal on January 25th. She does not accept blood transfusions so her gynecologist would like her to receive iron before her procedure. We can certainly take care of this for her.  Her cycles are still very heavy.  She had been going to hematology at [facility] and receiving IV iron there but decided she preferred our office and has come back.  She had a colonoscopy in May of last year which showed multiple nodules and ulcerations in the stomach body and antrum. She is now on Prilosec BID. She states that she also takes carafate before meals. Her appetite has decreased due to gallbladder pain. She has lost 41 lbs since April of last year. She is staying hydrated.  No fever, chills, n/v, cough, rash, dizziness, chest pain, palpitations, abdominal pain, constipation, diarrhea, blood in urine or stool. She does have some SOB with exertion.  She has had some numbness and tingling in her hands and feet that comes and goes. No swelling or tenderness in her extremities. No new aches or pains.    Medications:    Medication List       This list is accurate as of: 04/01/15 10:09 AM.  Always use your most recent med list.               acetaminophen 500 MG tablet  Commonly known as:  TYLENOL  Take 500 mg by mouth.     dicyclomine 10 MG capsule  Commonly known as:  BENTYL  Take 10 mg by mouth.     doxycycline 100 MG tablet  Commonly known as:  VIBRA-TABS  Take 1 tablet (100 mg total) by mouth 2 (two) times daily.     fluconazole 150 MG tablet  Commonly known as:  DIFLUCAN  Take 1 tablet (150 mg total) by mouth once. May repeat in three days if needed.     Folic Acid 5 MG Caps  Take by mouth every morning.     HYDROcodone-acetaminophen 5-325 MG tablet  Commonly known as:  NORCO/VICODIN  Take 1-2 tablets by mouth every 6 (six) hours as needed for severe pain.     omeprazole 10 MG capsule  Commonly known as:  PRILOSEC  Take 10 mg by mouth 2 (two) times daily.     ondansetron 4 MG tablet  Commonly known as:  ZOFRAN  Take 1 tablet (4 mg total) by mouth every 6 (six) hours.     predniSONE 20 MG tablet  Commonly known as:  DELTASONE  Take 3 po qd for 2 days then 2 po qd for 3 days,or as directed     Vitamin B 12 100 MCG Lozg  Take by mouth every morning.     vitamin C 500 MG tablet  Commonly known as:  ASCORBIC ACID  Take 500 mg by mouth 2 (two) times daily.        Allergies:  Allergies  Allergen Reactions  . Penicillins Hives and Shortness Of Breath  . Duloxetine     REACTION: diarrhea  Past Medical History, Surgical history, Social history, and Family History were reviewed and updated.  Review of Systems: All other 10 point review of systems is negative.   Physical Exam:  vitals were not taken for this visit.  Wt Readings from Last 3 Encounters:  12/27/14 157 lb (71.215 kg)  12/24/14 157 lb (71.215 kg)  07/21/14 182 lb 8 oz (82.781 kg)    Ocular: Sclerae unicteric, pupils equal, round and reactive to light Ear-nose-throat: Oropharynx clear, dentition fair Lymphatic: No cervical supraclavicular or axillary adenopathy Lungs no rales or rhonchi, good excursion bilaterally Heart regular rate and rhythm, no murmur appreciated Abd soft, has some tenderness in the left upper and lower quadrants, positive bowel sounds, no spleen or liver tip palpated on exam MSK no focal spinal tenderness, no joint edema Neuro: non-focal, well-oriented, appropriate affect Breasts:  Deferred  Lab Results  Component Value Date   WBC 5.3 04/01/2015   HGB 11.4* 04/01/2015   HCT 36.5 04/01/2015   MCV 68* 04/01/2015   PLT 414* 04/01/2015   Lab Results  Component Value Date   FERRITIN 44 06/25/2014   IRON 35* 06/25/2014   TIBC 370 06/25/2014   UIBC 335 06/25/2014   IRONPCTSAT 9* 06/25/2014   Lab Results  Component Value Date   RETICCTPCT 1.0 05/14/2014   RBC 5.36* 04/01/2015   RETICCTABS 48.7 05/14/2014   No results found for: KPAFRELGTCHN, LAMBDASER, KAPLAMBRATIO No results found for: IGGSERUM, IGA, IGMSERUM No results found for: Odetta Pink, SPEI   Chemistry      Component Value Date/Time   NA 138 12/27/2014 2020   K 3.4* 12/27/2014 2020   CL 101 12/27/2014 2020   CO2 26 12/27/2014 2020   BUN 7 12/27/2014 2020   CREATININE 0.66 12/27/2014 2020      Component Value Date/Time   CALCIUM 9.8 12/27/2014 2020   ALKPHOS 79 12/27/2014 2020   AST 14* 12/27/2014 2020   ALT 9* 12/27/2014 2020   BILITOT 0.5 12/27/2014 2020     Impression and Plan: [name] is a 40 yo African American female with iron deficiency anemia secondary to menometrorrhagia. She is symptomatic with fatigue, SOB, dizziness and numbness and tingling in her hands and feet.  She is scheduled for a partial hysterectomy and fibroid tumor removal later this month. We will go ahead and give her a does of Feraheme today and possibly a second dose next week to make sure she is ready for surgery.  Of note: She does not accept blood transfusions.  We will plan to see her back in 2 months for follow-up and labs.  She knows to call here with any questions or concerns. We can certainly see her sooner if need be.   Eliezer Bottom, NP 1/6/201710:09 AM

## 2015-06-03 ENCOUNTER — Ambulatory Visit: Payer: Managed Care, Other (non HMO)

## 2015-06-03 ENCOUNTER — Ambulatory Visit: Payer: Managed Care, Other (non HMO) | Admitting: Family

## 2015-06-03 ENCOUNTER — Other Ambulatory Visit: Payer: Managed Care, Other (non HMO)

## 2015-08-29 LAB — HM MAMMOGRAPHY

## 2015-08-31 ENCOUNTER — Encounter: Payer: Self-pay | Admitting: Family Medicine

## 2016-07-13 ENCOUNTER — Encounter: Payer: Self-pay | Admitting: Family Medicine

## 2017-06-03 ENCOUNTER — Other Ambulatory Visit: Payer: Self-pay

## 2017-06-03 ENCOUNTER — Emergency Department (HOSPITAL_BASED_OUTPATIENT_CLINIC_OR_DEPARTMENT_OTHER): Payer: Managed Care, Other (non HMO)

## 2017-06-03 ENCOUNTER — Encounter (HOSPITAL_BASED_OUTPATIENT_CLINIC_OR_DEPARTMENT_OTHER): Payer: Self-pay | Admitting: *Deleted

## 2017-06-03 ENCOUNTER — Inpatient Hospital Stay (HOSPITAL_BASED_OUTPATIENT_CLINIC_OR_DEPARTMENT_OTHER)
Admission: EM | Admit: 2017-06-03 | Discharge: 2017-06-06 | DRG: 387 | Disposition: A | Discharge status: Home / Self Care | Payer: Managed Care, Other (non HMO) | Attending: Internal Medicine | Admitting: Internal Medicine

## 2017-06-03 DIAGNOSIS — Z87891 Personal history of nicotine dependence: Secondary | ICD-10-CM | POA: Diagnosis not present

## 2017-06-03 DIAGNOSIS — Z881 Allergy status to other antibiotic agents status: Secondary | ICD-10-CM

## 2017-06-03 DIAGNOSIS — K660 Peritoneal adhesions (postprocedural) (postinfection): Secondary | ICD-10-CM | POA: Diagnosis present

## 2017-06-03 DIAGNOSIS — R1031 Right lower quadrant pain: Secondary | ICD-10-CM

## 2017-06-03 DIAGNOSIS — K56600 Partial intestinal obstruction, unspecified as to cause: Secondary | ICD-10-CM | POA: Diagnosis present

## 2017-06-03 DIAGNOSIS — K5090 Crohn's disease, unspecified, without complications: Secondary | ICD-10-CM | POA: Diagnosis present

## 2017-06-03 DIAGNOSIS — K56609 Unspecified intestinal obstruction, unspecified as to partial versus complete obstruction: Secondary | ICD-10-CM | POA: Diagnosis not present

## 2017-06-03 DIAGNOSIS — F419 Anxiety disorder, unspecified: Secondary | ICD-10-CM | POA: Diagnosis present

## 2017-06-03 DIAGNOSIS — Z79899 Other long term (current) drug therapy: Secondary | ICD-10-CM | POA: Diagnosis not present

## 2017-06-03 DIAGNOSIS — K5000 Crohn's disease of small intestine without complications: Secondary | ICD-10-CM | POA: Diagnosis not present

## 2017-06-03 DIAGNOSIS — Z880 Allergy status to penicillin: Secondary | ICD-10-CM | POA: Diagnosis not present

## 2017-06-03 DIAGNOSIS — K5651 Intestinal adhesions [bands], with partial obstruction: Secondary | ICD-10-CM | POA: Diagnosis not present

## 2017-06-03 DIAGNOSIS — K50012 Crohn's disease of small intestine with intestinal obstruction: Principal | ICD-10-CM | POA: Diagnosis present

## 2017-06-03 DIAGNOSIS — Z90710 Acquired absence of both cervix and uterus: Secondary | ICD-10-CM | POA: Diagnosis not present

## 2017-06-03 DIAGNOSIS — M797 Fibromyalgia: Secondary | ICD-10-CM | POA: Diagnosis present

## 2017-06-03 DIAGNOSIS — D638 Anemia in other chronic diseases classified elsewhere: Secondary | ICD-10-CM | POA: Diagnosis present

## 2017-06-03 DIAGNOSIS — R935 Abnormal findings on diagnostic imaging of other abdominal regions, including retroperitoneum: Secondary | ICD-10-CM

## 2017-06-03 DIAGNOSIS — D509 Iron deficiency anemia, unspecified: Secondary | ICD-10-CM

## 2017-06-03 DIAGNOSIS — I10 Essential (primary) hypertension: Secondary | ICD-10-CM | POA: Diagnosis present

## 2017-06-03 LAB — URINALYSIS, ROUTINE W REFLEX MICROSCOPIC
BILIRUBIN URINE: NEGATIVE
Glucose, UA: NEGATIVE mg/dL
KETONES UR: 15 mg/dL — AB
LEUKOCYTES UA: NEGATIVE
Nitrite: NEGATIVE
PH: 6 (ref 5.0–8.0)
PROTEIN: NEGATIVE mg/dL
Specific Gravity, Urine: <1.005 — ABNORMAL LOW (ref 1.005–1.030)

## 2017-06-03 LAB — CBC WITH DIFFERENTIAL/PLATELET
BASOS ABS: 0 10*3/uL (ref 0.0–0.1)
Basophils Relative: 0 %
EOS PCT: 1 %
Eosinophils Absolute: 0.1 10*3/uL (ref 0.0–0.7)
HCT: 39 % (ref 36.0–46.0)
HEMOGLOBIN: 13 g/dL (ref 12.0–15.0)
Lymphocytes Relative: 19 %
Lymphs Abs: 1.6 10*3/uL (ref 0.7–4.0)
MCH: 21.6 pg — AB (ref 26.0–34.0)
MCHC: 33.3 g/dL (ref 30.0–36.0)
MCV: 64.9 fL — ABNORMAL LOW (ref 78.0–100.0)
Monocytes Absolute: 1.1 10*3/uL — ABNORMAL HIGH (ref 0.1–1.0)
Monocytes Relative: 13 %
NEUTROS ABS: 5.6 10*3/uL (ref 1.7–7.7)
NEUTROS PCT: 67 %
PLATELETS: 615 10*3/uL — AB (ref 150–400)
RBC: 6.01 MIL/uL — AB (ref 3.87–5.11)
RDW: 14.1 % (ref 11.5–15.5)
WBC: 8.5 10*3/uL (ref 4.0–10.5)

## 2017-06-03 LAB — COMPREHENSIVE METABOLIC PANEL
ALBUMIN: 3.6 g/dL (ref 3.5–5.0)
ALK PHOS: 75 U/L (ref 38–126)
ALT: 9 U/L — AB (ref 14–54)
ANION GAP: 14 (ref 5–15)
AST: 17 U/L (ref 15–41)
BILIRUBIN TOTAL: 0.6 mg/dL (ref 0.3–1.2)
BUN: 6 mg/dL (ref 6–20)
CALCIUM: 9 mg/dL (ref 8.9–10.3)
CO2: 20 mmol/L — ABNORMAL LOW (ref 22–32)
CREATININE: 0.61 mg/dL (ref 0.44–1.00)
Chloride: 99 mmol/L — ABNORMAL LOW (ref 101–111)
GFR calc Af Amer: >60 mL/min (ref >60)
GFR calc non Af Amer: >60 mL/min (ref >60)
GLUCOSE: 83 mg/dL (ref 65–99)
Potassium: 3.6 mmol/L (ref 3.5–5.1)
SODIUM: 133 mmol/L — AB (ref 135–145)
Total Protein: 7.6 g/dL (ref 6.5–8.1)

## 2017-06-03 LAB — URINALYSIS, MICROSCOPIC (REFLEX)

## 2017-06-03 LAB — LIPASE, BLOOD: Lipase: 22 U/L (ref 11–51)

## 2017-06-03 MED ORDER — IOPAMIDOL (ISOVUE-300) INJECTION 61%
100.0000 mL | Freq: Once | INTRAVENOUS | Status: AC | PRN
  Administered 2017-06-03: 100 mL via INTRAVENOUS

## 2017-06-03 MED ORDER — MORPHINE SULFATE (PF) 4 MG/ML IV SOLN
4.0000 mg | Freq: Once | INTRAVENOUS | Status: AC
  Administered 2017-06-03: 4 mg via INTRAVENOUS
  Filled 2017-06-03: qty 1

## 2017-06-03 MED ORDER — ONDANSETRON HCL 4 MG/2ML IJ SOLN
4.0000 mg | Freq: Once | INTRAMUSCULAR | Status: AC
  Administered 2017-06-03: 4 mg via INTRAVENOUS
  Filled 2017-06-03: qty 2

## 2017-06-03 MED ORDER — SODIUM CHLORIDE 0.9 % IV BOLUS (SEPSIS)
1000.0000 mL | Freq: Once | INTRAVENOUS | Status: AC
  Administered 2017-06-03: 1000 mL via INTRAVENOUS

## 2017-06-03 NOTE — ED Notes (Signed)
Pt returned from CT °

## 2017-06-03 NOTE — ED Triage Notes (Signed)
Abdominal pain for a week. Vomiting x 1 today. She gets full fast when she eats or drinks. Bloating. IBS and crohns dx.

## 2017-06-03 NOTE — ED Notes (Signed)
Called [facility]) for consult to the hospitalist @[facility].
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
## 2017-06-04 ENCOUNTER — Encounter (HOSPITAL_BASED_OUTPATIENT_CLINIC_OR_DEPARTMENT_OTHER): Payer: Self-pay | Admitting: Emergency Medicine

## 2017-06-04 ENCOUNTER — Inpatient Hospital Stay (HOSPITAL_COMMUNITY): Payer: Managed Care, Other (non HMO)

## 2017-06-04 DIAGNOSIS — R935 Abnormal findings on diagnostic imaging of other abdominal regions, including retroperitoneum: Secondary | ICD-10-CM

## 2017-06-04 DIAGNOSIS — K5090 Crohn's disease, unspecified, without complications: Secondary | ICD-10-CM | POA: Diagnosis present

## 2017-06-04 DIAGNOSIS — K50012 Crohn's disease of small intestine with intestinal obstruction: Principal | ICD-10-CM

## 2017-06-04 DIAGNOSIS — R1031 Right lower quadrant pain: Secondary | ICD-10-CM

## 2017-06-04 DIAGNOSIS — K56600 Partial intestinal obstruction, unspecified as to cause: Secondary | ICD-10-CM | POA: Diagnosis present

## 2017-06-04 DIAGNOSIS — K5000 Crohn's disease of small intestine without complications: Secondary | ICD-10-CM

## 2017-06-04 LAB — BASIC METABOLIC PANEL
Anion gap: 9 (ref 5–15)
BUN: 5 mg/dL — AB (ref 6–20)
CHLORIDE: 106 mmol/L (ref 101–111)
CO2: 22 mmol/L (ref 22–32)
CREATININE: 0.63 mg/dL (ref 0.44–1.00)
Calcium: 8.6 mg/dL — ABNORMAL LOW (ref 8.9–10.3)
GFR calc Af Amer: >60 mL/min (ref >60)
GFR calc non Af Amer: >60 mL/min (ref >60)
GLUCOSE: 82 mg/dL (ref 65–99)
Potassium: 3.2 mmol/L — ABNORMAL LOW (ref 3.5–5.1)
Sodium: 137 mmol/L (ref 135–145)

## 2017-06-04 LAB — CBC WITH DIFFERENTIAL/PLATELET
BASOS PCT: 1 %
Basophils Absolute: 0.1 10*3/uL (ref 0.0–0.1)
EOS PCT: 2 %
Eosinophils Absolute: 0.1 10*3/uL (ref 0.0–0.7)
HCT: 38.6 % (ref 36.0–46.0)
Hemoglobin: 12.4 g/dL (ref 12.0–15.0)
LYMPHS ABS: 1.7 10*3/uL (ref 0.7–4.0)
Lymphocytes Relative: 29 %
MCH: 21.9 pg — AB (ref 26.0–34.0)
MCHC: 32.1 g/dL (ref 30.0–36.0)
MCV: 68.2 fL — AB (ref 78.0–100.0)
MONO ABS: 0.7 10*3/uL (ref 0.1–1.0)
Monocytes Relative: 12 %
NEUTROS ABS: 3.2 10*3/uL (ref 1.7–7.7)
Neutrophils Relative %: 56 %
Platelets: 496 10*3/uL — ABNORMAL HIGH (ref 150–400)
RBC: 5.66 MIL/uL — ABNORMAL HIGH (ref 3.87–5.11)
RDW: 13.6 % (ref 11.5–15.5)
WBC: 5.8 10*3/uL (ref 4.0–10.5)

## 2017-06-04 LAB — HEPATIC FUNCTION PANEL
ALT: 9 U/L — AB (ref 14–54)
AST: 15 U/L (ref 15–41)
Albumin: 3.1 g/dL — ABNORMAL LOW (ref 3.5–5.0)
Alkaline Phosphatase: 58 U/L (ref 38–126)
BILIRUBIN DIRECT: 0.1 mg/dL (ref 0.1–0.5)
BILIRUBIN INDIRECT: 0.4 mg/dL (ref 0.3–0.9)
Total Bilirubin: 0.5 mg/dL (ref 0.3–1.2)
Total Protein: 6.6 g/dL (ref 6.5–8.1)

## 2017-06-04 LAB — HIV ANTIBODY (ROUTINE TESTING W REFLEX): HIV Screen 4th Generation wRfx: NONREACTIVE

## 2017-06-04 LAB — MAGNESIUM: Magnesium: 1.7 mg/dL (ref 1.7–2.4)

## 2017-06-04 MED ORDER — PANTOPRAZOLE SODIUM 40 MG IV SOLR
40.0000 mg | INTRAVENOUS | Status: DC
  Administered 2017-06-04: 40 mg via INTRAVENOUS
  Filled 2017-06-04: qty 40

## 2017-06-04 MED ORDER — METHYLPREDNISOLONE SODIUM SUCC 40 MG IJ SOLR
30.0000 mg | Freq: Two times a day (BID) | INTRAMUSCULAR | Status: DC
  Administered 2017-06-04 – 2017-06-06 (×4): 30 mg via INTRAVENOUS
  Filled 2017-06-04 (×4): qty 1

## 2017-06-04 MED ORDER — ONDANSETRON HCL 4 MG PO TABS: 4.0000 mg | ORAL_TABLET | Freq: Four times a day (QID) | ORAL | Status: DC | PRN

## 2017-06-04 MED ORDER — ONDANSETRON HCL 4 MG/2ML IJ SOLN: 4.0000 mg | Freq: Four times a day (QID) | INTRAMUSCULAR | Status: DC | PRN

## 2017-06-04 MED ORDER — POTASSIUM CHLORIDE CRYS ER 20 MEQ PO TBCR
40.0000 meq | EXTENDED_RELEASE_TABLET | ORAL | Status: AC
  Administered 2017-06-04 (×2): 40 meq via ORAL
  Filled 2017-06-04 (×2): qty 2

## 2017-06-04 MED ORDER — SODIUM CHLORIDE 0.9 % IV SOLN
INTRAVENOUS | Status: DC
  Administered 2017-06-04: 06:00:00 via INTRAVENOUS

## 2017-06-04 MED ORDER — MAGNESIUM SULFATE 4 GM/100ML IV SOLN
4.0000 g | Freq: Once | INTRAVENOUS | Status: AC
  Administered 2017-06-04: 4 g via INTRAVENOUS
  Filled 2017-06-04: qty 100

## 2017-06-04 MED ORDER — KETOROLAC TROMETHAMINE 30 MG/ML IJ SOLN
30.0000 mg | Freq: Four times a day (QID) | INTRAMUSCULAR | Status: DC | PRN
  Administered 2017-06-05 – 2017-06-06 (×2): 30 mg via INTRAVENOUS
  Filled 2017-06-04 (×2): qty 1

## 2017-06-04 MED ORDER — MORPHINE SULFATE (PF) 2 MG/ML IV SOLN
1.0000 mg | INTRAVENOUS | Status: DC | PRN
  Administered 2017-06-04 (×2): 1 mg via INTRAVENOUS
  Filled 2017-06-04 (×2): qty 1

## 2017-06-04 MED ORDER — ACETAMINOPHEN 325 MG PO TABS: 650.0000 mg | ORAL_TABLET | Freq: Four times a day (QID) | ORAL | Status: DC | PRN

## 2017-06-04 MED ORDER — ACETAMINOPHEN 650 MG RE SUPP: 650.0000 mg | Freq: Four times a day (QID) | RECTAL | Status: DC | PRN

## 2017-06-04 MED ORDER — SODIUM CHLORIDE 0.9 % IV SOLN
INTRAVENOUS | Status: AC
  Administered 2017-06-05: 05:00:00 via INTRAVENOUS
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
## 2017-06-04 NOTE — Progress Notes (Signed)
Patient refusing pregnancy test from lab due to having a hysterectomy. Donne Hazel, RN
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
## 2017-06-04 NOTE — Progress Notes (Signed)
I have seen and assessed patient and I agree with Dr. Moise Boring assessment and plan.  Patient is a 42 year old female with history of Crohn's disease presented to the ED with worsening abdominal pain, persistent nausea and emesis last bowel movement 2 days prior to admission.  Patient also noted to be on Humira.  CT abdomen and pelvis done showed features consistent with Crohn's with possible partial small bowel obstruction versus early bowel obstruction.  Patient admitted placed on a clear liquid diet which she is tolerating.  Patient may need IV steroids.  Continue supportive care with IV fluids, antiemetics. Consult with gastroenterology for further evaluation and management.  No charge.

## 2017-06-05 DIAGNOSIS — D509 Iron deficiency anemia, unspecified: Secondary | ICD-10-CM

## 2017-06-05 DIAGNOSIS — R1031 Right lower quadrant pain: Secondary | ICD-10-CM

## 2017-06-05 DIAGNOSIS — R935 Abnormal findings on diagnostic imaging of other abdominal regions, including retroperitoneum: Secondary | ICD-10-CM

## 2017-06-05 DIAGNOSIS — K5651 Intestinal adhesions [bands], with partial obstruction: Secondary | ICD-10-CM

## 2017-06-05 LAB — BASIC METABOLIC PANEL
Anion gap: 7 (ref 5–15)
CHLORIDE: 110 mmol/L (ref 101–111)
CO2: 23 mmol/L (ref 22–32)
CREATININE: 0.55 mg/dL (ref 0.44–1.00)
Calcium: 8.6 mg/dL — ABNORMAL LOW (ref 8.9–10.3)
GFR calc Af Amer: >60 mL/min (ref >60)
GFR calc non Af Amer: >60 mL/min (ref >60)
GLUCOSE: 136 mg/dL — AB (ref 65–99)
Potassium: 4.5 mmol/L (ref 3.5–5.1)
SODIUM: 140 mmol/L (ref 135–145)

## 2017-06-05 LAB — CBC WITH DIFFERENTIAL/PLATELET
Basophils Absolute: 0 10*3/uL (ref 0.0–0.1)
Basophils Relative: 0 %
Eosinophils Absolute: 0 10*3/uL (ref 0.0–0.7)
Eosinophils Relative: 0 %
HEMATOCRIT: 36.3 % (ref 36.0–46.0)
HEMOGLOBIN: 11.4 g/dL — AB (ref 12.0–15.0)
LYMPHS ABS: 0.6 10*3/uL — AB (ref 0.7–4.0)
LYMPHS PCT: 9 %
MCH: 21.3 pg — AB (ref 26.0–34.0)
MCHC: 31.4 g/dL (ref 30.0–36.0)
MCV: 68 fL — AB (ref 78.0–100.0)
MONOS PCT: 2 %
Monocytes Absolute: 0.2 10*3/uL (ref 0.1–1.0)
NEUTROS ABS: 5.8 10*3/uL (ref 1.7–7.7)
NEUTROS PCT: 89 %
Platelets: 472 10*3/uL — ABNORMAL HIGH (ref 150–400)
RBC: 5.34 MIL/uL — AB (ref 3.87–5.11)
RDW: 13.6 % (ref 11.5–15.5)
WBC: 6.5 10*3/uL (ref 4.0–10.5)

## 2017-06-05 LAB — RETICULOCYTES
RBC.: 5.16 MIL/uL — ABNORMAL HIGH (ref 3.87–5.11)
RETIC COUNT ABSOLUTE: 61.9 10*3/uL (ref 19.0–186.0)
Retic Ct Pct: 1.2 % (ref 0.4–3.1)

## 2017-06-05 LAB — FERRITIN: Ferritin: 309 ng/mL — ABNORMAL HIGH (ref 11–307)

## 2017-06-05 LAB — IRON AND TIBC
IRON: 51 ug/dL (ref 28–170)
Saturation Ratios: 22 % (ref 10.4–31.8)
TIBC: 234 ug/dL — AB (ref 250–450)
UIBC: 183 ug/dL

## 2017-06-05 LAB — VITAMIN B12: Vitamin B-12: 896 pg/mL (ref 180–914)

## 2017-06-05 LAB — SEDIMENTATION RATE: SED RATE: 17 mm/h (ref 0–22)

## 2017-06-05 LAB — C-REACTIVE PROTEIN: CRP: 3.8 mg/dL — ABNORMAL HIGH (ref <1.0)

## 2017-06-05 LAB — FOLATE: Folate: 12.3 ng/mL (ref >5.9)

## 2017-06-05 LAB — MAGNESIUM: Magnesium: 2.3 mg/dL (ref 1.7–2.4)

## 2017-06-05 MED ORDER — PANTOPRAZOLE SODIUM 40 MG PO TBEC
40.0000 mg | DELAYED_RELEASE_TABLET | Freq: Every day | ORAL | Status: DC
  Administered 2017-06-05: 40 mg via ORAL
  Filled 2017-06-05: qty 1

## 2017-06-05 MED ORDER — ENSURE ENLIVE PO LIQD: 237.0000 mL | Freq: Two times a day (BID) | ORAL | Status: DC

## 2017-06-05 MED ORDER — POLYETHYLENE GLYCOL 3350 17 G PO PACK
17.0000 g | PACK | Freq: Every day | ORAL | Status: DC
  Administered 2017-06-05 – 2017-06-06 (×2): 17 g via ORAL
  Filled 2017-06-05 (×2): qty 1

## 2017-06-05 NOTE — Progress Notes (Signed)
Initial Nutrition Assessment  DOCUMENTATION CODES:   Not applicable  INTERVENTION:   Ensure Enlive po BID, each supplement provides 350 kcal and 20 grams of protein  Will provide a daily snack  Encouraged PO intakes    NUTRITION DIAGNOSIS:   Increased nutrient needs related to chronic illness(Crohn's) as evidenced by estimated needs.   GOAL:   Patient will meet greater than or equal to 90% of their needs   MONITOR:   PO intake, Supplement acceptance, Diet advancement, Weight trends, Labs  REASON FOR ASSESSMENT:   Malnutrition Screening Tool    ASSESSMENT:   42 yo female admitted with abdominal pain d/t Crohn's colitis exacerbation with partial small bowel obstruction; recent history of wt loss d/t early satiety   3/13--Per MD progress note, pt is on medications to control pain and promote GI motility. Pt is on a trial SOFT diet to see if she tolerates food and avoid a surgical intervention. Pt is passing gas, but has not had a BM  Labs indicate anemia of chronic disease   Spoke with patient who reports: -diagnosed with Crohn's ~1 year ago; worked in the past with an RD to identify foods that cause Crohn's flare-ups -Prior to Crohn's diagnosis, pt had been losing weight, but was gaining wt back by selective eating and medications. UBW as of 2 weeks ago ~160-165 lbs --Pt reports early satiety after eating 1/2 of a meal per day (e.g. a chicken salad with squash, cucumber or shrimp/salmon with rice). Only able to eat the one meal per day --Describes the early satiety in terms of foods feeling "heavy on the stomach". Some foods are more problematic (e.g.beef, potatoes) --Even when able to eat, pt reports difficulty swallowing; food has very slow transit time down esophagus and she feels it coming back up and has to work to keep it going down. Pt also reports feeling bloated with eating and gets flare ups of Crohn's when she eats acidic foods or foods such as onions,  garlic, beans in particular --Due to current flare up, pt has not been able to tolerate food and most drinks. Has been just drinking a lot of water and some coffee. Reports that half tea + lemonade intermittently tolerated, but sometimes causes flare ups -- Pt ate first meal on soft diet (eggs, cheese) immediately prior to nutrition assessment interview. Able to eat ~75% and is waiting to see if she is able to tolerate it --Pt agreed to try ONS to ensure she is getting adequate calories and protein --Pt reports that her nails have become brittle and her hair is also brittle, dull, and falls out more easily than usual.  Patient weight history is not in agreement with pt report of weight status; however, current PO intake puts her at risk for malnutrition.   Wt Readings from Last 15 Encounters:  06/03/17 154 lb (69.9 kg)  04/01/15 142 lb (64.4 kg)  12/27/14 157 lb (71.2 kg)  12/24/14 157 lb (71.2 kg)  07/21/14 182 lb 8 oz (82.8 kg)  06/25/14 183 lb (83 kg)  05/14/14 184 lb (83.5 kg)  05/04/14 183 lb 12.8 oz (83.4 kg)  04/08/14 180 lb (81.6 kg)  04/07/14 178 lb (80.7 kg)  03/29/14 181 lb 8 oz (82.3 kg)  12/09/12 190 lb (86.2 kg)  11/18/12 186 lb (84.4 kg)  02/29/12 198 lb (89.8 kg)  01/10/11 186 lb (84.4 kg)   Medications:  Prednisone Protonix miralax NaCl @ 54m/hr  Labs:  Mag and Phos not available  K increased from baseline (3.2, now 4.5) BUN <5 L Iron & B12 & folate WNL Ferritin 309 H CRP 3.8 H   NUTRITION - FOCUSED PHYSICAL EXAM: Mild depletion orbital; no other fat or muscle depletions.  Hair is dull, dry, easily pluckable; nails are brittle and thinner than usual (per pt)   Diet Order:  DIET SOFT Room service appropriate? Yes; Fluid consistency: Thin  EDUCATION NEEDS:   Education needs have been addressed  Skin:  Skin Assessment: Reviewed RN Assessment  Last BM:  3/13; Type 5  Height:   Ht Readings from Last 1 Encounters:  06/03/17 5' 5"  (1.651 m)     Weight:   Wt Readings from Last 1 Encounters:  06/03/17 154 lb (69.9 kg)    Ideal Body Weight:  56.8 kg  BMI:  Body mass index is 25.63 kg/m.  Estimated Nutritional Needs:   Kcal:  1500-1700 (MSJ1.1-1.3)  Protein:  85-105 grams (1.2-1.5 g/kg Crohn's flare)  Fluid:  >=1.6 L    Edmonia Lynch Dietetic Intern Pager: [phone]

## 2017-06-05 NOTE — Progress Notes (Signed)

## 2017-06-05 NOTE — Progress Notes (Addendum)
Progress Note   ROLLANDE THURSBY LKJ:[id number] DOB: Jun 01, 1975 DOA: 06/03/2017 PCP: Burnis Medin, MD   LOS: 2 days    Brief Narrative:  [name] is a 42 year old female with a medical history significant for Crohn's, treated with Humira, who presented to the ED on 06/03/2017 due to 2 weeks of intermittent, generalized abdominal pain associated with nausea and emesis. Upon presentation to the ED, patient was afebrile with stable vitals. Initial labs demonstrated Na 133, chloride 99, bicarb 20, ALT 9, RBC 6.01, MCV 64.0, MCH 21.6, platelets 615. RBC morphology was remarkable for teardrop cells. Abdominal/ pelvic CT demonstrated thickened inflamed loops of small bowel in anterior/ mid abdomen consistent with enteritis and possible partial small bowel obstruction with possible etiologies Crohn's vs. early small bowel obstruction. Abdomen was mildly distended, tender with guarding in the right lower quadrant on initial presentation.  Patient admitted to the hospital on the working diagnosis of Crohn's flare vs. partial bowel obstruction.   Assessment/Plan:   Principal Problem:   Partial bowel obstruction (HCC) Active Problems:   Crohn's disease ([place])   Partial small bowel obstruction ([place])  1. Exacerbation of Crohn's colitis causing partial small bowel obstruction: Patient is still rating her pain a 7/10, but wishes to avoid pain medication due to fear of further constipation. She is passing flatulence, but no BM. Most recent KUB demonstrated bowel gas pattern persistent with ileus/enteritis with unlikely small bowel obstruction etiology. No free air present. GI consult was appreciated. Change diet to soft, low residue diet as tolerated by patient. Patient should be switched back to full liquids if pain increases. Continue supportive measures for Crohn's flare with IV solu-medrol BID, morphine q 4 hours as needed, zofran q 4 prn, and IVFs. Add Miralax. Patient will need Adalimumab ab  checked upon discharge in outpatient setting with potential need to change current therapy regimen.   2. Microcytic hypochromic anemia: Latest CBC demonstrated hgb 11.4, HCT 36.3, MCV 68, MCH 21.3. Check iron study to check for iron deficiency, but most likely due to malabsorption status during acute Crohn flare. Obtain CBC tomorrow am.    Family Communication/Anticipated D/C date and plan/Code Status   DVT prophylaxis: SCDs Code Status: Full Family Communication: discussed with patient Disposition Plan: home   Medical Consultants:  GI    Antimicrobials:     Subjective:  Patient is awake and alert. She believes her pain level has increased slightly and is now a 7/10, but is holding off on pain medications due to the fear of further constipation. Patient complains of increased anxiety over lack of BM. She is tolerating full liquids well with no new episodes of vomiting. She is passing flatulence.    Objective:   Vitals:   06/04/17 0054 06/04/17 1430 06/04/17 2127 06/05/17 0609  BP: 123/68 103/76 113/74 120/77  Pulse: (!) 108 89 (!) 103 90  Resp: 20 20 20 20   Temp: 99 F (37.2 C) 98.5 F (36.9 C) 98.9 F (37.2 C) 98.6 F (37 C)  TempSrc: Oral Oral Oral Oral  SpO2: 99% 99% 99% 99%  Weight:      Height:        Intake/Output Summary (Last 24 hours) at 06/05/2017 1108 Last data filed at 06/05/2017 0611 Gross per 24 hour  Intake 3580 ml  Output 2250 ml  Net 1330 ml   Filed Weights   06/03/17 1648  Weight: 69.9 kg (154 lb)     Physical Exam:   Constitutional: NAD, awake,  alert, and oriented x3 Eyes: lids and conjunctivae normal ENMT: Mucous membranes are moist.  Neck: normal, supple, no masses Respiratory: clear to auscultation bilaterally, no wheezing, no crackles. Normal respiratory effort. No accessory muscle use.  Cardiovascular: Regular rate and rhythm, normal S1-S2, no murmurs / rubs / gallops. No LE edema.  Abdomen: soft, diffuse tenderness increased  in RLQ and nondistended. Bowel sounds present.  Musculoskeletal: no clubbing / cyanosis. No joint deformity upper and lower extremities. Normal muscle tone.  Skin: no rashes, lesions, ulcers.  Neurologic: non-focal Psychiatric: Alert and oriented x 3    Data Reviewed:   I have independently reviewed following labs and imaging studies:   CBC: Recent Labs  Lab 06/07/17 2100 06/04/17 0447 06/05/17 0450  WBC 8.5 5.8 6.5  NEUTROABS 5.6 3.2 5.8  HGB 13.0 12.4 11.4*  HCT 39.0 38.6 36.3  MCV 64.9* 68.2* 68.0*  PLT 615* 496* 161*   Basic Metabolic Panel: Recent Labs  Lab 06/07/2017 2039 06/04/17 0447 06/05/17 0450  NA 133* 137 140  K 3.6 3.2* 4.5  CL 99* 106 110  CO2 20* 22 23  GLUCOSE 83 82 136*  BUN 6 5* <5*  CREATININE 0.61 0.63 0.55  CALCIUM 9.0 8.6* 8.6*  MG  --  1.7 2.3   GFR: Estimated Creatinine Clearance: 90 mL/min (by C-G formula based on SCr of 0.55 mg/dL). Liver Function Tests: Recent Labs  Lab 06/07/17 2039 06/04/17 0447  AST 17 15  ALT 9* 9*  ALKPHOS 75 58  BILITOT 0.6 0.5  PROT 7.6 6.6  ALBUMIN 3.6 3.1*   Recent Labs  Lab 06-07-17 2039  LIPASE 22   No results for input(s): AMMONIA in the last 168 hours. Coagulation Profile: No results for input(s): INR, PROTIME in the last 168 hours. Cardiac Enzymes: No results for input(s): CKTOTAL, CKMB, CKMBINDEX, TROPONINI in the last 168 hours. BNP (last 3 results) No results for input(s): PROBNP in the last 8760 hours. HbA1C: No results for input(s): HGBA1C in the last 72 hours. CBG: No results for input(s): GLUCAP in the last 168 hours. Lipid Profile: No results for input(s): CHOL, HDL, LDLCALC, TRIG, CHOLHDL, LDLDIRECT in the last 72 hours. Thyroid Function Tests: No results for input(s): TSH, T4TOTAL, FREET4, T3FREE, THYROIDAB in the last 72 hours. Anemia Panel: No results for input(s): VITAMINB12, FOLATE, FERRITIN, TIBC, IRON, RETICCTPCT in the last 72 hours. Urine analysis:    Component  Value Date/Time   COLORURINE YELLOW 06-07-17 1651   APPEARANCEUR CLEAR 06-07-2017 1651   LABSPEC <1.005 (L) Jun 07, 2017 1651   PHURINE 6.0 2017/06/07 1651   GLUCOSEU NEGATIVE 2017-06-07 1651   HGBUR TRACE (A) 06-07-2017 1651   HGBUR negative 12/29/2008 0832   BILIRUBINUR NEGATIVE Jun 07, 2017 1651   KETONESUR 15 (A) 06/07/17 1651   PROTEINUR NEGATIVE 06-07-2017 1651   UROBILINOGEN 0.2 12/29/2008 0832   NITRITE NEGATIVE 06-07-2017 1651   LEUKOCYTESUR NEGATIVE Jun 07, 2017 1651   Sepsis Labs: Invalid input(s): PROCALCITONIN, LACTICIDVEN  No results found for this or any previous visit (from the past 240 hour(s)).    Radiology Studies: Ct Abdomen Pelvis W Contrast  Result Date: Jun 07, 2017 CLINICAL DATA:  42 year old female with abdominal pain and vomiting. EXAM: CT ABDOMEN AND PELVIS WITH CONTRAST TECHNIQUE: Multidetector CT imaging of the abdomen and pelvis was performed using the standard protocol following bolus administration of intravenous contrast. CONTRAST:  165m ISOVUE-300 IOPAMIDOL (ISOVUE-300) INJECTION 61% COMPARISON:  Abdominal ultrasound dated 12/27/2014 FINDINGS: Lower chest: The visualized lung bases are clear. No intra-abdominal free  air. There is a small free fluid within the pelvis. Hepatobiliary: The liver is unremarkable. No intrahepatic biliary ductal dilatation. Multiple small stones noted within the gallbladder. No evidence of acute cholecystitis by CT. Ultrasound may provide better evaluation if clinically indicated. Artifact versus a tiny stone in the central CBD at the head of the pancreas (coronal series 5, image 44 and series 2, image 31). Pancreas: Unremarkable. No pancreatic ductal dilatation or surrounding inflammatory changes. Spleen: Normal in size without focal abnormality. Adrenals/Urinary Tract: The adrenal glands are unremarkable. There is no hydronephrosis on either side. There is symmetric enhancement and excretion of contrast by both kidneys. The  visualized ureters and urinary bladder are unremarkable. Stomach/Bowel: There is thickened and inflamed loops of small bowel in the anterior mid abdomen most consistent with enteritis. There is abutment of adjacent loops of bowel to each other and to the anterior peritoneal wall in the mid abdomen consistent with adhesions. There are segments with probable partial or low-grade obstruction secondary to adhesion and inflammation for example in the right lower quadrant (series 5, image 32, series 2, image 57) and in the right hemiabdomen (series 2, image 43, series 5, image 27). Follow-up is recommended to ensure passage of oral contrast into the colon. The appendix is normal. Vascular/Lymphatic: The abdominal aorta and IVC appear unremarkable. No portal venous gas. Top-normal mesenteric lymph nodes, likely reactive. Reproductive: The uterus is not visualized, likely surgically absent. There is a 2 cm right ovarian corpus luteum. The left ovary is unremarkable. Surgical material noted in the region of the adnexa bilaterally. Other: None Musculoskeletal: No acute or significant osseous findings. IMPRESSION: 1. Segments of inflammatory changes and thickening of the small bowel primarily in the right hemiabdomen and right lower quadrant most consistent with enteritis. There is associated adhesions of the bowel loops to each other and to the anterior peritoneal wall with probable degree of partial or low-grade obstruction. Follow-up with radiographs recommended to ensure passage of oral contrast into the colon. 2. Cholelithiasis. 3. A 2 cm corpus luteum in the right ovary. Electronically Signed   By: Anner Crete M.D.   On: 06/03/2017 21:39   Dg Abd 2 Views  Result Date: 06/04/2017 CLINICAL DATA:  Abdominal pain with nausea EXAM: ABDOMEN - 2 VIEW COMPARISON:  CT abdomen and pelvis June 03, 2017 FINDINGS: Supine and upright images obtained. There are foci of calcification in the right upper quadrant which CT  demonstrates represents small gallstones within the gallbladder. There remains mild generalized bowel dilatation without appreciable air-fluid levels. No free air. There are phleboliths in the pelvis. Contrast is seen within the urinary bladder. Lung bases are clear. IMPRESSION: The bowel gas pattern suggests persistent ileus or enteritis. Bowel obstruction not felt to be likely. No free air. Evidence of cholelithiasis. Electronically Signed   By: Lowella Grip III M.D.   On: 06/04/2017 10:20      Medication:   . methylPREDNISolone (SOLU-MEDROL) injection  30 mg Intravenous BID  . pantoprazole (PROTONIX) IV  40 mg Intravenous Q24H    Continuous Infusions: . sodium chloride 125 mL/hr at 06/05/17 0517      Time spent: 25 minutes  Signed, Lanna Poche, PA-S [employer] Email: [email or web address]

## 2017-06-05 NOTE — Progress Notes (Addendum)
Patient ID: [name], female   DOB: [birth year], 42 y.o.   MRN: [id number]    Progress Note   Subjective    Anxious , but otherwise OK . Pain at worst 7/10 -trying not to use pain meds. Has been up walking. Tolerating full liquids and hungry. No distention, passing gas but no BM   Objective   Vital signs in last 24 hours: Temp:  [98.5 F (36.9 C)-98.9 F (37.2 C)] 98.6 F (37 C) (03/13 0609) Pulse Rate:  [89-103] 90 (03/13 0609) Resp:  [20] 20 (03/13 0609) BP: (103-120)/(74-77) 120/77 (03/13 0609) SpO2:  [99 %] 99 % (03/13 0609) Last BM Date: 06/01/17 General:    AA female in NAD Heart:  Regular rate and rhythm; no murmurs Lungs: Respirations even and unlabored, lungs CTA bilaterally Abdomen:  Soft, tender  Diffusely more so in RLQ  and nondistended. Normal bowel sounds. Extremities:  Without edema. Neurologic:  Alert and oriented,  grossly normal neurologically. Psych:  Cooperative. Normal mood and affect.  Intake/Output from previous day: 03/12 0701 - 03/13 0700 In: 3845.8 [P.O.:1200; I.V.:2645.8] Out: 2250 [Urine:2250] Intake/Output this shift: No intake/output data recorded.  Lab Results: Recent Labs    06/03/17 2100 06/04/17 0447 06/05/17 0450  WBC 8.5 5.8 6.5  HGB 13.0 12.4 11.4*  HCT 39.0 38.6 36.3  PLT 615* 496* 472*   BMET Recent Labs    06/03/17 2039 06/04/17 0447 06/05/17 0450  NA 133* 137 140  K 3.6 3.2* 4.5  CL 99* 106 110  CO2 20* 22 23  GLUCOSE 83 82 136*  BUN 6 5* <5*  CREATININE 0.61 0.63 0.55  CALCIUM 9.0 8.6* 8.6*   LFT Recent Labs    06/04/17 0447  PROT 6.6  ALBUMIN 3.1*  AST 15  ALT 9*  ALKPHOS 58  BILITOT 0.5  BILIDIR 0.1  IBILI 0.4   PT/INR No results for input(s): LABPROT, INR in the last 72 hours.  Studies/Results: Ct Abdomen Pelvis W Contrast  Result Date: 06/03/2017 CLINICAL DATA:  42 year old female with abdominal pain and vomiting. EXAM: CT ABDOMEN AND PELVIS WITH CONTRAST TECHNIQUE: Multidetector CT  imaging of the abdomen and pelvis was performed using the standard protocol following bolus administration of intravenous contrast. CONTRAST:  155m ISOVUE-300 IOPAMIDOL (ISOVUE-300) INJECTION 61% COMPARISON:  Abdominal ultrasound dated 12/27/2014 FINDINGS: Lower chest: The visualized lung bases are clear. No intra-abdominal free air. There is a small free fluid within the pelvis. Hepatobiliary: The liver is unremarkable. No intrahepatic biliary ductal dilatation. Multiple small stones noted within the gallbladder. No evidence of acute cholecystitis by CT. Ultrasound may provide better evaluation if clinically indicated. Artifact versus a tiny stone in the central CBD at the head of the pancreas (coronal series 5, image 44 and series 2, image 31). Pancreas: Unremarkable. No pancreatic ductal dilatation or surrounding inflammatory changes. Spleen: Normal in size without focal abnormality. Adrenals/Urinary Tract: The adrenal glands are unremarkable. There is no hydronephrosis on either side. There is symmetric enhancement and excretion of contrast by both kidneys. The visualized ureters and urinary bladder are unremarkable. Stomach/Bowel: There is thickened and inflamed loops of small bowel in the anterior mid abdomen most consistent with enteritis. There is abutment of adjacent loops of bowel to each other and to the anterior peritoneal wall in the mid abdomen consistent with adhesions. There are segments with probable partial or low-grade obstruction secondary to adhesion and inflammation for example in the right lower quadrant (series 5, image 32, series 2, image 57)  and in the right hemiabdomen (series 2, image 43, series 5, image 27). Follow-up is recommended to ensure passage of oral contrast into the colon. The appendix is normal. Vascular/Lymphatic: The abdominal aorta and IVC appear unremarkable. No portal venous gas. Top-normal mesenteric lymph nodes, likely reactive. Reproductive: The uterus is not  visualized, likely surgically absent. There is a 2 cm right ovarian corpus luteum. The left ovary is unremarkable. Surgical material noted in the region of the adnexa bilaterally. Other: None Musculoskeletal: No acute or significant osseous findings. IMPRESSION: 1. Segments of inflammatory changes and thickening of the small bowel primarily in the right hemiabdomen and right lower quadrant most consistent with enteritis. There is associated adhesions of the bowel loops to each other and to the anterior peritoneal wall with probable degree of partial or low-grade obstruction. Follow-up with radiographs recommended to ensure passage of oral contrast into the colon. 2. Cholelithiasis. 3. A 2 cm corpus luteum in the right ovary. Electronically Signed   By: Anner Crete M.D.   On: 06/03/2017 21:39   Dg Abd 2 Views  Result Date: 06/04/2017 CLINICAL DATA:  Abdominal pain with nausea EXAM: ABDOMEN - 2 VIEW COMPARISON:  CT abdomen and pelvis June 03, 2017 FINDINGS: Supine and upright images obtained. There are foci of calcification in the right upper quadrant which CT demonstrates represents small gallstones within the gallbladder. There remains mild generalized bowel dilatation without appreciable air-fluid levels. No free air. There are phleboliths in the pelvis. Contrast is seen within the urinary bladder. Lung bases are clear. IMPRESSION: The bowel gas pattern suggests persistent ileus or enteritis. Bowel obstruction not felt to be likely. No free air. Evidence of cholelithiasis. Electronically Signed   By: Lowella Grip III M.D.   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 Hopefully can get her through this episode without surgery, will need a few more days. If improves with steroids then will need to  Check adalimumab Ab  Etc, outpt, and consider switch to another biologic .    Contact  Amy [place], P.A.-C               [phone]   Principal Problem:   Partial bowel obstruction (HCC) Active Problems:   Crohn's disease ([place])   Partial small bowel obstruction ([place])    LOS: 2 days   Amy Esterwood  06/05/2017, 9:45 AM  GI ATTENDING  Interval history data reviewed. Patient seen and examined. Agree with interval progress note. Continue care as outlined for partial bowel obstruction secondary to Crohn's disease.  Docia Chuck. Geri Seminole., M.D. [facility] Division of Gastroenterology

## 2017-06-06 ENCOUNTER — Other Ambulatory Visit: Payer: Self-pay

## 2017-06-06 DIAGNOSIS — K56609 Unspecified intestinal obstruction, unspecified as to partial versus complete obstruction: Secondary | ICD-10-CM

## 2017-06-06 DIAGNOSIS — R935 Abnormal findings on diagnostic imaging of other abdominal regions, including retroperitoneum: Secondary | ICD-10-CM

## 2017-06-06 DIAGNOSIS — K50018 Crohn's disease of small intestine with other complication: Secondary | ICD-10-CM

## 2017-06-06 LAB — CBC
HCT: 32.4 % — ABNORMAL LOW (ref 36.0–46.0)
Hemoglobin: 10.1 g/dL — ABNORMAL LOW (ref 12.0–15.0)
MCH: 21.4 pg — AB (ref 26.0–34.0)
MCHC: 31.2 g/dL (ref 30.0–36.0)
MCV: 68.8 fL — AB (ref 78.0–100.0)
PLATELETS: 485 10*3/uL — AB (ref 150–400)
RBC: 4.71 MIL/uL (ref 3.87–5.11)
RDW: 13.7 % (ref 11.5–15.5)
WBC: 8.3 10*3/uL (ref 4.0–10.5)

## 2017-06-06 LAB — BASIC METABOLIC PANEL
ANION GAP: 8 (ref 5–15)
BUN: 6 mg/dL (ref 6–20)
CALCIUM: 8.7 mg/dL — AB (ref 8.9–10.3)
CO2: 24 mmol/L (ref 22–32)
Chloride: 110 mmol/L (ref 101–111)
Creatinine, Ser: 0.61 mg/dL (ref 0.44–1.00)
GFR calc Af Amer: >60 mL/min (ref >60)
GLUCOSE: 117 mg/dL — AB (ref 65–99)
Potassium: 4.4 mmol/L (ref 3.5–5.1)
SODIUM: 142 mmol/L (ref 135–145)

## 2017-06-06 MED ORDER — PANTOPRAZOLE SODIUM 40 MG PO TBEC: 40.0000 mg | DELAYED_RELEASE_TABLET | Freq: Every day | ORAL | 0 refills | Status: AC

## 2017-06-06 MED ORDER — OXYCODONE HCL 5 MG PO TABS: 5.0000 mg | ORAL_TABLET | Freq: Three times a day (TID) | ORAL | 0 refills | Status: DC | PRN

## 2017-06-06 MED ORDER — ENSURE ENLIVE PO LIQD: 237.0000 mL | Freq: Two times a day (BID) | ORAL | 0 refills | Status: AC

## 2017-06-06 MED ORDER — POLYETHYLENE GLYCOL 3350 17 G PO PACK: 17.0000 g | PACK | Freq: Every day | ORAL | 0 refills | Status: AC

## 2017-06-06 MED ORDER — PREDNISONE 10 MG PO TABS: 40.0000 mg | ORAL_TABLET | Freq: Every day | ORAL | 0 refills | Status: DC

## 2017-06-06 NOTE — Progress Notes (Addendum)
Patient ID: [name], female   DOB: 24-Dec-1975, 42 y.o.   MRN: [id number]    Progress Note   Subjective  Feels OK - had BM yesterday , passing flatus. Abdomen no longer feeling distended No eating much but still wanting to stay on solid food- eating does cause pain She would like to go home  Objective   Vital signs in last 24 hours: Temp:  [98.8 F (37.1 C)] 98.8 F (37.1 C) (03/14 0616) Pulse Rate:  [73] 73 (03/14 0616) Resp:  [18] 18 (03/14 0616) BP: (114)/(65) 114/65 (03/14 0616) SpO2:  [99 %] 99 % (03/14 0616) Last BM Date: 06/05/17 General:    AA  female in NAD Heart:  Regular rate and rhythm; no murmurs Lungs: Respirations even and unlabored, lungs CTA bilaterally Abdomen:  Soft, tender  Mild diffuse,and nondistended. Normal bowel sounds. Extremities:  Without edema. Neurologic:  Alert and oriented,  grossly normal neurologically. Psych:  Cooperative. Normal mood and affect.  Intake/Output from previous day: 03/13 0701 - 03/14 0700 In: 480 [P.O.:480] Out: -  Intake/Output this shift: No intake/output data recorded.  Lab Results: Recent Labs    06/04/17 0447 06/05/17 0450 06/06/17 0508  WBC 5.8 6.5 8.3  HGB 12.4 11.4* 10.1*  HCT 38.6 36.3 32.4*  PLT 496* 472* 485*   BMET Recent Labs    06/04/17 0447 06/05/17 0450 06/06/17 0508  NA 137 140 142  K 3.2* 4.5 4.4  CL 106 110 110  CO2 22 23 24   GLUCOSE 82 136* 117*  BUN 5* <5* 6  CREATININE 0.63 0.55 0.61  CALCIUM 8.6* 8.6* 8.7*   LFT Recent Labs    06/04/17 0447  PROT 6.6  ALBUMIN 3.1*  AST 15  ALT 9*  ALKPHOS 58  BILITOT 0.5  BILIDIR 0.1  IBILI 0.4   PT/INR No results for input(s): LABPROT, INR in the last 72 hours.  Studies/Results: Dg Abd 2 Views  Result Date: 06/04/2017 CLINICAL DATA:  Abdominal pain with nausea EXAM: ABDOMEN - 2 VIEW COMPARISON:  CT abdomen and pelvis June 03, 2017 FINDINGS: Supine and upright images obtained. There are foci of calcification in the right upper  quadrant which CT demonstrates represents small gallstones within the gallbladder. There remains mild generalized bowel dilatation without appreciable air-fluid levels. No free air. There are phleboliths in the pelvis. Contrast is seen within the urinary bladder. Lung bases are clear. IMPRESSION: The bowel gas pattern suggests persistent ileus or enteritis. Bowel obstruction not felt to be likely. No free air. Evidence of cholelithiasis. Electronically Signed   By: Lowella Grip III M.D.   On: 06/04/2017 10:20       Assessment / Plan:    #34  42 year old African-American female with Crohn's ileitis, admitted with partial small bowel obstruction secondary to active Crohn's in the mid to distal small bowel. She is improving with IV steroids, tolerating by mouth's though still having increasing pain postprandially, and is having bowel movements. #2 history of iron deficiency anemia, previously secondary to menorrhagia-iron counts currently normal  Plan; patient is stable for discharge to home today She will remain out of work tomorrow, through the weekend and was advised that if she needs a few days next week to call the office for note. Full liquid to soft diet and gradually advance as she tolerates Please discharge home on prednisone 40 mg by mouth every morning which she will continue until follow-up office visit in 2 weeks She may use MiraLAX as needed and  resume Linzess 145 g daily which she was taking at home. She will continue Humira for now, next dose due in about a week and a half Continue dicyclomine as needed Have arranged for a follow-up office visits with Amy Esterwood PA-C on 06/20/2017 at 3 PM.-third floor [facility] Patient needs to come to our office on Friday, 06/14/2017 for adalimumab antibody level and drug trough level. Patient aware please place on her discharge instructions. She has been instructed to call our office for any problems in the  interim.  Contact  Amy Wickes, P.A.-C               [phone]  Principal Problem:   Partial bowel obstruction (HCC) Active Problems:   Microcytic anemia   Crohn's disease (HCC)   Partial small bowel obstruction (HCC)   Right lower quadrant abdominal pain   Abnormal x-ray of abdomen   LOS: 3 days   Amy Esterwood  06/06/2017, 9:05 AM  GI ATTENDING  Interval history data reviewed. Agree with interval progress note as outlined including discharge medications and follow-up plans(please see above).  Docia Chuck. Geri Seminole., M.D. [facility] Division of Gastroenterology

## 2017-06-06 NOTE — Discharge Summary (Signed)
PATIENT DETAILS Name: [name] Age: 42 y.o. Sex: female Date of Birth: [birth year] MRN: [id number]. Admitting Physician: Rise Patience, MD POE:[id number], Standley Brooking, MD  Admit Date: 06/03/2017 Discharge date: 06/06/2017  Recommendations for Outpatient Follow-up:  1. Follow up with PCP in 1-2 weeks 2. Please obtain BMP/CBC in one week  Admitted From:  Home   Disposition: [place]: No  Equipment/Devices: None  Discharge Condition: Stable  CODE STATUS: FULL CODE  Diet recommendation:  Soft diet  Brief Summary: See H&P, Labs, Consult and Test reports for all details in brief, Patient is a 42 year old female with Crohn's disease on Humira-admitted with abdominal pain-found to have Crohn's colitis exacerbation with partial small bowel obstruction.  Brief Hospital Course: Flare of Crohn's disease with partial small bowel obstruction: Admitted and started on steroids, diet slowly advanced. Rapidly improved-by day of discharge pain significantly better-had 2 BM's yesterday. Seen by gastroenterology today with recommendations to discharge on 40 mg of prednisone until seen by them on follow-up. Decision to resume Biologics would be made on outpatient follow-up with gastroenterology.  Microcytic anemia: Anemia panel more consistent with anemia of chronic disease-we will need further workup for microcytosis and the outpatient setting including a hemoglobin electrophoresis.  Procedures/Studies: None  Discharge Diagnoses:  Principal Problem:   Partial bowel obstruction (HCC) Active Problems:   Microcytic anemia   Crohn's disease (HCC)   Partial small bowel obstruction (HCC)   Right lower quadrant abdominal pain   Abnormal x-ray of abdomen   Discharge Instructions:  Activity:  As tolerated with Full fall precautions use walker/cane & assistance as needed  Discharge Instructions    Call MD for:  persistant nausea and vomiting   Complete by:  As  directed    Call MD for:  severe uncontrolled pain   Complete by:  As directed    Diet - low sodium heart healthy   Complete by:  As directed    Discharge instructions   Complete by:  As directed    Follow with Primary MD  Panosh, Standley Brooking, MD in 1 week  Follow with GI MD as instructed   Please get a complete blood count and chemistry panel checked by your Primary MD at your next visit, and again as instructed by your Primary MD.  Get Medicines reviewed and adjusted: Please take all your medications with you for your next visit with your Primary MD  Laboratory/radiological data: Please request your Primary MD to go over all hospital tests and procedure/radiological results at the follow up, please ask your Primary MD to get all Hospital records sent to his/her office.  In some cases, they will be blood work, cultures and biopsy results pending at the time of your discharge. Please request that your primary care M.D. follows up on these results.  Also Note the following: If you experience worsening of your admission symptoms, develop shortness of breath, life threatening emergency, suicidal or homicidal thoughts you must seek medical attention immediately by calling 911 or calling your MD immediately  if symptoms less severe.  You must read complete instructions/literature along with all the possible adverse reactions/side effects for all the Medicines you take and that have been prescribed to you. Take any new Medicines after you have completely understood and accpet all the possible adverse reactions/side effects.   Do not drive when taking Pain medications or sleeping medications (Benzodaizepines)  Do not take more than prescribed Pain, Sleep and Anxiety Medications. It is not  advisable to combine anxiety,sleep and pain medications without talking with your primary care practitioner  Special Instructions: If you have smoked or chewed Tobacco  in the last 2 yrs please stop smoking,  stop any regular Alcohol  and or any Recreational drug use.  Wear Seat belts while driving.  Please note: You were cared for by a hospitalist during your hospital stay. Once you are discharged, your primary care physician will handle any further medical issues. Please note that NO REFILLS for any discharge medications will be authorized once you are discharged, as it is imperative that you return to your primary care physician (or establish a relationship with a primary care physician if you do not have one) for your post hospital discharge needs so that they can reassess your need for medications and monitor your lab values.   Increase activity slowly   Complete by:  As directed      Allergies as of 06/06/2017      Reactions   Penicillins Hives, Shortness Of Breath   Has patient had a PCN reaction causing immediate rash, facial/tongue/throat swelling, SOB or lightheadedness with hypotension: Yes Has patient had a PCN reaction causing severe rash involving mucus membranes or skin necrosis: No Has patient had a PCN reaction that required hospitalization: Yes Has patient had a PCN reaction occurring within the last 10 years: Unknown If all of the above answers are "NO", then may proceed with Cephalosporin use.   Duloxetine Diarrhea      Medication List    STOP taking these medications   HUMIRA PEN-CD/UC/HS STARTER 40 MG/0.8ML Pnkt Generic drug:  Adalimumab     TAKE these medications   dicyclomine 10 MG capsule Commonly known as:  BENTYL Take 10 mg by mouth 3 (three) times daily as needed for spasms.   feeding supplement (ENSURE ENLIVE) Liqd Take 237 mLs by mouth 2 (two) times daily between meals.   Folic Acid 5 MG Caps Take 5 mg by mouth daily.   oxyCODONE 5 MG immediate release tablet Commonly known as:  ROXICODONE Take 1 tablet (5 mg total) by mouth every 8 (eight) hours as needed.   pantoprazole 40 MG tablet Commonly known as:  PROTONIX Take 1 tablet (40 mg total) by  mouth at bedtime.   Papaya Chew Chew 1 tablet by mouth daily.   polyethylene glycol packet Commonly known as:  MIRALAX / GLYCOLAX Take 17 g by mouth daily.   predniSONE 10 MG tablet Commonly known as:  DELTASONE Take 4 tablets (40 mg total) by mouth daily.      Follow-up Information    Panosh, Standley Brooking, MD. Schedule an appointment as soon as possible for a visit in 1 week(s).   Specialties:  Internal Medicine, Pediatrics Contact information: [place] [address] [phone]        Alfredia Ferguson, PA-C Follow up on 06/20/2017.   Specialty:  Gastroenterology Why:  at 3 pm Contact information: [place] [address] [phone]          Allergies  Allergen Reactions  . Penicillins Hives and Shortness Of Breath    Has patient had a PCN reaction causing immediate rash, facial/tongue/throat swelling, SOB or lightheadedness with hypotension: Yes Has patient had a PCN reaction causing severe rash involving mucus membranes or skin necrosis: No Has patient had a PCN reaction that required hospitalization: Yes Has patient had a PCN reaction occurring within the last 10 years: Unknown If all of the  above answers are "NO", then may proceed with Cephalosporin use.   . Duloxetine Diarrhea    Consultations:   GI  Other Procedures/Studies: Ct Abdomen Pelvis W Contrast  Result Date: 06/03/2017 CLINICAL DATA:  42 year old female with abdominal pain and vomiting. EXAM: CT ABDOMEN AND PELVIS WITH CONTRAST TECHNIQUE: Multidetector CT imaging of the abdomen and pelvis was performed using the standard protocol following bolus administration of intravenous contrast. CONTRAST:  164m ISOVUE-300 IOPAMIDOL (ISOVUE-300) INJECTION 61% COMPARISON:  Abdominal ultrasound dated 12/27/2014 FINDINGS: Lower chest: The visualized lung bases are clear. No intra-abdominal free air. There is a small free fluid within the pelvis. Hepatobiliary: The liver is  unremarkable. No intrahepatic biliary ductal dilatation. Multiple small stones noted within the gallbladder. No evidence of acute cholecystitis by CT. Ultrasound may provide better evaluation if clinically indicated. Artifact versus a tiny stone in the central CBD at the head of the pancreas (coronal series 5, image 44 and series 2, image 31). Pancreas: Unremarkable. No pancreatic ductal dilatation or surrounding inflammatory changes. Spleen: Normal in size without focal abnormality. Adrenals/Urinary Tract: The adrenal glands are unremarkable. There is no hydronephrosis on either side. There is symmetric enhancement and excretion of contrast by both kidneys. The visualized ureters and urinary bladder are unremarkable. Stomach/Bowel: There is thickened and inflamed loops of small bowel in the anterior mid abdomen most consistent with enteritis. There is abutment of adjacent loops of bowel to each other and to the anterior peritoneal wall in the mid abdomen consistent with adhesions. There are segments with probable partial or low-grade obstruction secondary to adhesion and inflammation for example in the right lower quadrant (series 5, image 32, series 2, image 57) and in the right hemiabdomen (series 2, image 43, series 5, image 27). Follow-up is recommended to ensure passage of oral contrast into the colon. The appendix is normal. Vascular/Lymphatic: The abdominal aorta and IVC appear unremarkable. No portal venous gas. Top-normal mesenteric lymph nodes, likely reactive. Reproductive: The uterus is not visualized, likely surgically absent. There is a 2 cm right ovarian corpus luteum. The left ovary is unremarkable. Surgical material noted in the region of the adnexa bilaterally. Other: None Musculoskeletal: No acute or significant osseous findings. IMPRESSION: 1. Segments of inflammatory changes and thickening of the small bowel primarily in the right hemiabdomen and right lower quadrant most consistent with  enteritis. There is associated adhesions of the bowel loops to each other and to the anterior peritoneal wall with probable degree of partial or low-grade obstruction. Follow-up with radiographs recommended to ensure passage of oral contrast into the colon. 2. Cholelithiasis. 3. A 2 cm corpus luteum in the right ovary. Electronically Signed   By: AAnner CreteM.D.   On: 06/03/2017 21:39   Dg Abd 2 Views  Result Date: 06/04/2017 CLINICAL DATA:  Abdominal pain with nausea EXAM: ABDOMEN - 2 VIEW COMPARISON:  CT abdomen and pelvis June 03, 2017 FINDINGS: Supine and upright images obtained. There are foci of calcification in the right upper quadrant which CT demonstrates represents small gallstones within the gallbladder. There remains mild generalized bowel dilatation without appreciable air-fluid levels. No free air. There are phleboliths in the pelvis. Contrast is seen within the urinary bladder. Lung bases are clear. IMPRESSION: The bowel gas pattern suggests persistent ileus or enteritis. Bowel obstruction not felt to be likely. No free air. Evidence of cholelithiasis. Electronically Signed   By: WLowella GripIII M.D.   On: 06/04/2017 10:20      TODAY-DAY OF DISCHARGE:  Subjective:   Samiyah Nickerson today has no headache,no chest abdominal pain,no new weakness tingling or numbness, feels much better wants to go home today.   Objective:   Blood pressure 114/65, pulse 73, temperature 98.8 F (37.1 C), temperature source Oral, resp. rate 18, height 5' 5"  (1.651 m), weight 69.9 kg (154 lb), last menstrual period 12/12/2014, SpO2 99 %.  Intake/Output Summary (Last 24 hours) at 06/06/2017 0953 Last data filed at 06/05/2017 1807 Gross per 24 hour  Intake 480 ml  Output -  Net 480 ml   Filed Weights   06/03/17 1648  Weight: 69.9 kg (154 lb)    Exam: Awake Alert, Oriented *3, No new F.N deficits, Normal affect [place].AT,PERRAL Supple Neck,No JVD, No cervical lymphadenopathy appriciated.   Symmetrical Chest wall movement, Good air movement bilaterally, CTAB RRR,No Gallops,Rubs or new Murmurs, No Parasternal Heave +ve B.Sounds, Abd Soft, Non tender, No organomegaly appriciated, No rebound -guarding or rigidity. No Cyanosis, Clubbing or edema, No new Rash or bruise   PERTINENT RADIOLOGIC STUDIES: Ct Abdomen Pelvis W Contrast  Result Date: 06/03/2017 CLINICAL DATA:  42 year old female with abdominal pain and vomiting. EXAM: CT ABDOMEN AND PELVIS WITH CONTRAST TECHNIQUE: Multidetector CT imaging of the abdomen and pelvis was performed using the standard protocol following bolus administration of intravenous contrast. CONTRAST:  133m ISOVUE-300 IOPAMIDOL (ISOVUE-300) INJECTION 61% COMPARISON:  Abdominal ultrasound dated 12/27/2014 FINDINGS: Lower chest: The visualized lung bases are clear. No intra-abdominal free air. There is a small free fluid within the pelvis. Hepatobiliary: The liver is unremarkable. No intrahepatic biliary ductal dilatation. Multiple small stones noted within the gallbladder. No evidence of acute cholecystitis by CT. Ultrasound may provide better evaluation if clinically indicated. Artifact versus a tiny stone in the central CBD at the head of the pancreas (coronal series 5, image 44 and series 2, image 31). Pancreas: Unremarkable. No pancreatic ductal dilatation or surrounding inflammatory changes. Spleen: Normal in size without focal abnormality. Adrenals/Urinary Tract: The adrenal glands are unremarkable. There is no hydronephrosis on either side. There is symmetric enhancement and excretion of contrast by both kidneys. The visualized ureters and urinary bladder are unremarkable. Stomach/Bowel: There is thickened and inflamed loops of small bowel in the anterior mid abdomen most consistent with enteritis. There is abutment of adjacent loops of bowel to each other and to the anterior peritoneal wall in the mid abdomen consistent with adhesions. There are segments with  probable partial or low-grade obstruction secondary to adhesion and inflammation for example in the right lower quadrant (series 5, image 32, series 2, image 57) and in the right hemiabdomen (series 2, image 43, series 5, image 27). Follow-up is recommended to ensure passage of oral contrast into the colon. The appendix is normal. Vascular/Lymphatic: The abdominal aorta and IVC appear unremarkable. No portal venous gas. Top-normal mesenteric lymph nodes, likely reactive. Reproductive: The uterus is not visualized, likely surgically absent. There is a 2 cm right ovarian corpus luteum. The left ovary is unremarkable. Surgical material noted in the region of the adnexa bilaterally. Other: None Musculoskeletal: No acute or significant osseous findings. IMPRESSION: 1. Segments of inflammatory changes and thickening of the small bowel primarily in the right hemiabdomen and right lower quadrant most consistent with enteritis. There is associated adhesions of the bowel loops to each other and to the anterior peritoneal wall with probable degree of partial or low-grade obstruction. Follow-up with radiographs recommended to ensure passage of oral contrast into the colon. 2. Cholelithiasis. 3. A 2 cm corpus luteum  in the right ovary. Electronically Signed   By: Anner Crete M.D.   On: 06/03/2017 21:39   Dg Abd 2 Views  Result Date: 06/04/2017 CLINICAL DATA:  Abdominal pain with nausea EXAM: ABDOMEN - 2 VIEW COMPARISON:  CT abdomen and pelvis June 03, 2017 FINDINGS: Supine and upright images obtained. There are foci of calcification in the right upper quadrant which CT demonstrates represents small gallstones within the gallbladder. There remains mild generalized bowel dilatation without appreciable air-fluid levels. No free air. There are phleboliths in the pelvis. Contrast is seen within the urinary bladder. Lung bases are clear. IMPRESSION: The bowel gas pattern suggests persistent ileus or enteritis. Bowel  obstruction not felt to be likely. No free air. Evidence of cholelithiasis. Electronically Signed   By: Lowella Grip III M.D.   On: 06/04/2017 10:20     PERTINENT LAB RESULTS: CBC: Recent Labs    06/05/17 0450 06/06/17 0508  WBC 6.5 8.3  HGB 11.4* 10.1*  HCT 36.3 32.4*  PLT 472* 485*   CMET CMP     Component Value Date/Time   NA 142 06/06/2017 0508   K 4.4 06/06/2017 0508   CL 110 06/06/2017 0508   CO2 24 06/06/2017 0508   GLUCOSE 117 (H) 06/06/2017 0508   BUN 6 06/06/2017 0508   CREATININE 0.61 06/06/2017 0508   CALCIUM 8.7 (L) 06/06/2017 0508   PROT 6.6 06/04/2017 0447   ALBUMIN 3.1 (L) 06/04/2017 0447   AST 15 06/04/2017 0447   ALT 9 (L) 06/04/2017 0447   ALKPHOS 58 06/04/2017 0447   BILITOT 0.5 06/04/2017 0447   GFRNONAA >60 06/06/2017 0508   GFRAA >60 06/06/2017 0508    GFR Estimated Creatinine Clearance: 90 mL/min (by C-G formula based on SCr of 0.61 mg/dL). Recent Labs    06/03/17 2039  LIPASE 22   No results for input(s): CKTOTAL, CKMB, CKMBINDEX, TROPONINI in the last 72 hours. Invalid input(s): POCBNP No results for input(s): DDIMER in the last 72 hours. No results for input(s): HGBA1C in the last 72 hours. No results for input(s): CHOL, HDL, LDLCALC, TRIG, CHOLHDL, LDLDIRECT in the last 72 hours. No results for input(s): TSH, T4TOTAL, T3FREE, THYROIDAB in the last 72 hours.  Invalid input(s): FREET3 Recent Labs    06/05/17 1226  VITAMINB12 896  FOLATE 12.3  FERRITIN 309*  TIBC 234*  IRON 51  RETICCTPCT 1.2   Coags: No results for input(s): INR in the last 72 hours.  Invalid input(s): PT Microbiology: No results found for this or any previous visit (from the past 240 hour(s)).  FURTHER DISCHARGE INSTRUCTIONS:  Get Medicines reviewed and adjusted: Please take all your medications with you for your next visit with your Primary MD  Laboratory/radiological data: Please request your Primary MD to go over all hospital tests and  procedure/radiological results at the follow up, please ask your Primary MD to get all Hospital records sent to his/her office.  In some cases, they will be blood work, cultures and biopsy results pending at the time of your discharge. Please request that your primary care M.D. goes through all the records of your hospital data and follows up on these results.  Also Note the following: If you experience worsening of your admission symptoms, develop shortness of breath, life threatening emergency, suicidal or homicidal thoughts you must seek medical attention immediately by calling 911 or calling your MD immediately  if symptoms less severe.  You must read complete instructions/literature along with all the possible adverse  reactions/side effects for all the Medicines you take and that have been prescribed to you. Take any new Medicines after you have completely understood and accpet all the possible adverse reactions/side effects.   Do not drive when taking Pain medications or sleeping medications (Benzodaizepines)  Do not take more than prescribed Pain, Sleep and Anxiety Medications. It is not advisable to combine anxiety,sleep and pain medications without talking with your primary care practitioner  Special Instructions: If you have smoked or chewed Tobacco  in the last 2 yrs please stop smoking, stop any regular Alcohol  and or any Recreational drug use.  Wear Seat belts while driving.  Please note: You were cared for by a hospitalist during your hospital stay. Once you are discharged, your primary care physician will handle any further medical issues. Please note that NO REFILLS for any discharge medications will be authorized once you are discharged, as it is imperative that you return to your primary care physician (or establish a relationship with a primary care physician if you do not have one) for your post hospital discharge needs so that they can reassess your need for medications and  monitor your lab values.  Total Time spent coordinating discharge including counseling, education and face to face time equals 35 min  Signed: Oren Binet 06/06/2017 9:53 AM

## 2017-06-07 ENCOUNTER — Telehealth: Payer: Self-pay | Admitting: Family Medicine

## 2017-06-07 ENCOUNTER — Encounter: Payer: Self-pay | Admitting: Physician Assistant

## 2017-06-07 NOTE — Telephone Encounter (Signed)
I left a voice message for pt to return my call.

## 2017-06-10 NOTE — Telephone Encounter (Signed)
I left a voice message for pt to return my call.

## 2017-06-11 NOTE — Telephone Encounter (Signed)
I have not seen pt since 2016 and   Gi has been managing her  crohn's  .   Please make sure she has appt with GI

## 2017-06-11 NOTE — Telephone Encounter (Signed)
Transition Care Management Follow-up Telephone Call PATIENT DETAILS Name: SHARLYNN SECKINGER Age: 42 y.o. Sex: female Date of Birth: 01-06-1976 MRN: [id number]. Admitting Physician: Rise Patience, MD MAU:[id number], Standley Brooking, MD  Admit Date: 06/03/2017 Discharge date: 06/06/2017  Recommendations for Outpatient Follow-up:  1. Follow up with PCP in 1-2 weeks 2. Please obtain BMP/CBC in one week  Brief Hospital Course: Flare of Crohn's disease with partial small bowel obstruction: Admitted and started on steroids, diet slowly advanced. Rapidly improved-by day of discharge pain significantly better-had 2 BM's yesterday. Seen by gastroenterology today with recommendations to discharge on 40 mg of prednisone until seen by them on follow-up. Decision to resume Biologics would be made on outpatient follow-up with gastroenterology    How have you been since you were released from the hospital? "better"   Do you understand why you were in the hospital? yes   Do you understand the discharge instructions? yes   Where were you discharged to? Home  Items Reviewed:  Medications reviewed: yes  Allergies reviewed: yes  Dietary changes reviewed: yes  Referrals reviewed: yes   Functional Questionnaire:   Activities of Daily Living (ADLs):   She states they are independent in the following: ambulation, bathing and hygiene, feeding, continence, grooming, toileting and dressing States they require assistance with the following: none   Any transportation issues/concerns?: no   Any patient concerns? no   Confirmed importance and date/time of follow-up visits scheduled yes  Provider Appointment booked with Dr. Regis Bill on 06/18/2017 Tuesday at 3:30 pm  Confirmed with patient if condition begins to worsen call PCP or go to the ER.  Patient was given the office number and encouraged to call back with question or concerns.  : yes

## 2017-06-12 NOTE — Telephone Encounter (Signed)
She does have appointment with [facility] on June 20, 2017.

## 2017-06-14 ENCOUNTER — Other Ambulatory Visit: Payer: Self-pay

## 2017-06-14 ENCOUNTER — Other Ambulatory Visit: Payer: Managed Care, Other (non HMO)

## 2017-06-14 ENCOUNTER — Telehealth: Payer: Self-pay | Admitting: Physician Assistant

## 2017-06-14 DIAGNOSIS — K50018 Crohn's disease of small intestine with other complication: Secondary | ICD-10-CM

## 2017-06-14 NOTE — Telephone Encounter (Signed)
Patient states APP Amy told patient she could go back to work this Monday and wants to know if she can have a work note emailed to  [email or web address]

## 2017-06-14 NOTE — Telephone Encounter (Signed)
Spoke with the patient. She has given me the fax number to send the note to. Faxed to [phone].

## 2017-06-17 ENCOUNTER — Encounter: Payer: Self-pay | Admitting: Internal Medicine
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
## 2017-06-18 ENCOUNTER — Ambulatory Visit: Payer: Managed Care, Other (non HMO) | Admitting: Internal Medicine

## 2017-06-18 ENCOUNTER — Encounter: Payer: Self-pay | Admitting: Internal Medicine

## 2017-06-18 VITALS — BP 134/80 | HR 105 | Temp 97.4°F | Wt 154.4 lb

## 2017-06-18 DIAGNOSIS — D649 Anemia, unspecified: Secondary | ICD-10-CM

## 2017-06-18 DIAGNOSIS — K8020 Calculus of gallbladder without cholecystitis without obstruction: Secondary | ICD-10-CM | POA: Diagnosis not present

## 2017-06-18 DIAGNOSIS — Z09 Encounter for follow-up examination after completed treatment for conditions other than malignant neoplasm: Secondary | ICD-10-CM

## 2017-06-18 DIAGNOSIS — K50012 Crohn's disease of small intestine with intestinal obstruction: Secondary | ICD-10-CM

## 2017-06-18 DIAGNOSIS — R109 Unspecified abdominal pain: Secondary | ICD-10-CM

## 2017-06-18 NOTE — Patient Instructions (Addendum)
At this time    No lab today .  Will reviewed the record and let you know of any loose ends   To be addressed that isnt   .    GI related  Please sign up for my chart     For communication ease.   Then will decide on follow .  Up

## 2017-06-20 ENCOUNTER — Ambulatory Visit: Payer: Managed Care, Other (non HMO) | Admitting: Physician Assistant

## 2017-06-20 ENCOUNTER — Encounter: Payer: Self-pay | Admitting: Physician Assistant

## 2017-06-20 VITALS — BP 112/78 | HR 68 | Ht 65.0 in | Wt 157.2 lb

## 2017-06-20 DIAGNOSIS — K56600 Partial intestinal obstruction, unspecified as to cause: Secondary | ICD-10-CM | POA: Diagnosis not present

## 2017-06-20 DIAGNOSIS — K50019 Crohn's disease of small intestine with unspecified complications: Secondary | ICD-10-CM | POA: Diagnosis not present

## 2017-06-20 MED ORDER — DICYCLOMINE HCL 20 MG PO TABS: ORAL_TABLET | ORAL | 3 refills | Status: DC

## 2017-06-20 MED ORDER — PREDNISONE 10 MG PO TABS: ORAL_TABLET | ORAL | 1 refills | Status: DC

## 2017-06-20 NOTE — Progress Notes (Signed)
Subjective:    Patient ID: [name], female    DOB: 01-10-1976, 43 y.o.   MRN: [id number]  HPI [name] is a pleasant 42 year old African-American female, recently established with Dr. Henrene Pastor when she was seen in consultation at [facility] long hospital during admission 3/11 through 06/06/2017.  Patient was admitted with a partial small bowel obstruction.  CT of the abdomen and pelvis showed multiple gallstones, she had thickened inflamed loops of small bowel in the anterior mid abdomen consistent with enteritis and also had abutment of adjacent loops of bowel to each other in the peritoneal cavity consistent with adhesions. Patient had been diagnosed with Crohn's ileitis in 2016 and had previously been followed by Dr. Alice Reichert in [facility] who is no longer in practice there.  EGD in 2016 showed gastritis and small ulcerations, biopsies were negative for H. pylori.  Colonoscopy was done May 2016 showing ulceration edema in the terminal ileum.  Biopsies were nonspecific and colon was felt to be normal.  She initially was not treated.  She then had follow-up CT in December 2017 that showed a thickened wall in segments of the distal small bowel and terminal ileum as well as prominent lymphadenopathy at the root of the mesentery.  She was started on Humira in March 2018 40 mg subcu every 2 weeks.  She reported that her symptoms improved after starting Humira but never completely resolved.  He had been having episodes of worsening abdominal discomfort and ongoing diarrhea over the past several months prior to her recent admission. She was treated with bowel rest and IV  Solu-Medrol, and symptoms improved over the next few days.  We were able to advance her diet and she was discharged to home on prednisone 40 mg p.o. daily, dicyclomine as needed, she was to resume Linzess as needed for constipation and was taking MiraLAX 17 g daily. She comes back in today for follow-up saying she still having quite a bit of  discomfort in her abdomen which seems to be exacerbated prior to a bowel movement and after eating.  She describes it as a burning cramping type of pain sometimes still wanting to double her over.  She has not had any nausea or vomiting.  Appetite has been fair.  She is having fairly normal bowel movements every day or 2.  She did see a small amount of blood last week.  She is asking if there is anything different that she can take other than the 10 mg dicyclomine that will help her pain.  She does have a prescription for oxycodone which she is not taking regularly.  She was concerned as this was constipating. She has returned to work. She did come into the office on June 14, 2017 to have adalimumab antibody level and trough level done.  Results are still pending  Review of Systems Pertinent positive and negative review of systems were noted in the above HPI section.  All other review of systems was otherwise negative.  Outpatient Encounter Medications as of 06/20/2017  Medication Sig  . dicyclomine (BENTYL) 10 MG capsule Take 10 mg by mouth 3 (three) times daily as needed for spasms.   . feeding supplement, ENSURE ENLIVE, (ENSURE ENLIVE) LIQD Take 237 mLs by mouth 2 (two) times daily between meals.  Marland Kitchen oxyCODONE (ROXICODONE) 5 MG immediate release tablet Take 1 tablet (5 mg total) by mouth every 8 (eight) hours as needed.  . pantoprazole (PROTONIX) 40 MG tablet Take 1 tablet (40 mg total) by  mouth at bedtime.  Michail Jewels CHEW Chew 1 tablet by mouth daily.  . polyethylene glycol (MIRALAX / GLYCOLAX) packet Take 17 g by mouth daily.  . predniSONE (DELTASONE) 10 MG tablet Decrease to 56m every morning on Saturday 3/30, then in 2 weeks decrease to 236mand stay on it until next office visit.  . [DISCONTINUED] predniSONE (DELTASONE) 10 MG tablet Take 4 tablets (40 mg total) by mouth daily.  . Marland Kitchenicyclomine (BENTYL) 20 MG tablet Take one capsule one hour before meals.  . [DISCONTINUED] Folic Acid 5 MG CAPS  Take 5 mg by mouth daily.    No facility-administered encounter medications on file as of 06/20/2017.    Allergies  Allergen Reactions  . Penicillins Hives and Shortness Of Breath    Has patient had a PCN reaction causing immediate rash, facial/tongue/throat swelling, SOB or lightheadedness with hypotension: Yes Has patient had a PCN reaction causing severe rash involving mucus membranes or skin necrosis: No Has patient had a PCN reaction that required hospitalization: Yes Has patient had a PCN reaction occurring within the last 10 years: Unknown If all of the above answers are "NO", then may proceed with Cephalosporin use.   . Duloxetine Diarrhea   Patient Active Problem List   Diagnosis Date Noted  . Abnormal CT of the abdomen   . Abnormal x-ray of abdomen   . Crohn's disease ([facility]03/02/2018  . Partial small bowel obstruction ([facility]03/02/2018  . Partial bowel obstruction ([facility]03/01/2018  . Gall stones 07/24/2014  . Iron deficiency anemia 04/08/2014  . Intentional underdosing of medication regimen by patient due to financial hardship 12/09/2012  . Hypokalemia 12/09/2012  . Microcytic anemia 12/09/2012  . Stressful job 12/09/2012  . Fibromyalgia syndrome 11/22/2012  . Other malaise and fatigue 11/22/2012  . Visit for preventive health examination 01/10/2011  . Hand pain 11/21/2010  . Subcutaneous nodules 11/21/2010  . Preventive measure 11/21/2010  . Goiter   . HYPERTENSION 11/15/2008  . UNSPECIFIED ANEMIA 01/23/2008  . ADVERSE REACTION TO MEDICATION 01/23/2008  . VITAMIN D DEFICIENCY 11/20/2007  . Adjustment disorder with depressed mood 11/20/2007  . GOITER NOS 05/23/2006  . DEPRESSION, MAJOR, RECURRENT 05/23/2006  . CONSTIPATION 05/23/2006  . BLOOD IN STOOL, MELENA 05/23/2006  . GANGLION, UNSPECIFIED 05/23/2006  . Myalgia and myositis, unspecified 05/23/2006   Social History   Socioeconomic History  . Marital status: Single    Spouse name: Not on file  . Number  of children: Not on file  . Years of education: Not on file  . Highest education level: Not on file  Occupational History  . Not on file  Social Needs  . Financial resource strain: Not on file  . Food insecurity:    Worry: Not on file    Inability: Not on file  . Transportation needs:    Medical: Not on file    Non-medical: Not on file  Tobacco Use  . Smoking status: Never Smoker  . Smokeless tobacco: Never Used  Substance and Sexual Activity  . Alcohol use: Yes    Alcohol/week: 0.0 oz    Comment: occ  . Drug use: No  . Sexual activity: Not on file  Lifestyle  . Physical activity:    Days per week: Not on file    Minutes per session: Not on file  . Stress: Not on file  Relationships  . Social connections:    Talks on phone: Not on file    Gets together: Not on file  Attends religious service: Not on file    Active member of club or organization: Not on file    Attends meetings of clubs or organizations: Not on file    Relationship status: Not on file  . Intimate partner violence:    Fear of current or ex partner: Not on file    Emotionally abused: Not on file    Physically abused: Not on file    Forced sexual activity: Not on file  Other Topics Concern  . 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 Kolasa's family history includes Aneurysm in her father; Deep vein thrombosis in her unknown relative; Hyperlipidemia in her mother; Hypertension in her unknown relative; Other in her cousin; Sleep apnea in her mother.      Objective:    Vitals:   06/20/17 1509  BP: 112/78  Pulse: 68    Physical Exam; well-developed African-American female in no acute distress, pleasant blood pressure 112/78 pulse 68, height 5 foot 5, weight 157, BMI 26.1.  HEENT; nontraumatic normocephalic EOMI PERRLA sclera anicteric, Cardiovascular; regular rate and rhythm with  S1-S2, Pulmonary; clear bilaterally, Abdomen; soft, bowel sounds are present she is tender across the mid abdomen and more tender in the right lower quadrant no rebound no palpable mass.  Rectal ;exam not done, Extremities ;no clubbing cyanosis or edema skin warm and dry, Neuro psych; mood and affect appropriate       Assessment & Plan:   #23 42 year old African-American female with Crohn's ileitis with recent hospitalization with partial small bowel obstruction By CT there was felt to be a component of adhesions between adjacent loops of small bowel. She has improved with Solu-Medrol but is still quite symptomatic with abdominal pain exacerbated by eating and bowel movements. Patient developed the partial small bowel obstruction in setting of Humira use over the past 1 year.  Rule out loss of response to Humira, development of Humira antibodies.  Patient never had complete resolution of symptoms since starting Humira. There is concern for underlying stricturing/stenosis in addition to active inflammation.  #2 history of iron deficiency anemia-most recent iron studies normal  Plan; decrease prednisone to 30 mg p.o. every morning times 2 weeks, if no increase in symptoms she will decrease to 20 mg p.o. daily until her next office visit Increase dicyclomine to 20 mg p.o. 1 hour AC-no prescription sent Patient was confused about the Humira, she will resume Humira Saturday, 06/22/2017-  40 mg every 2 weeks. Decision will be made regarding continuing Humira versus switching to another biologic pending antibody level and trough level I offered her a prescription for tramadol, she prefers not to take anything that will be more constipating.  She still has some oxycodone left has been using this very sparingly. Continue Protonix 40 mg p.o. daily Continue MiraLAX 17 g in 8 ounces water daily and as needed Linzess 145 mcg. I think she will benefit from CT enterography at some point in the next couple of  months depending on her course. She has follow-up office visit with Dr. Henrene Pastor in early May.  She knows to call in the interim for any problems, and can work in sooner.  Amy S Esterwood PA-C 06/20/2017   Cc: Burnis Medin, MD

## 2017-06-20 NOTE — Patient Instructions (Signed)
If you are age 43 or older, your body mass index should be between 23-30. Your Body mass index is 26.17 kg/m. If this is out of the aforementioned range listed, please consider follow up with your Primary Care Provider.  If you are age 75 or younger, your body mass index should be between 19-25. Your Body mass index is 26.17 kg/m. If this is out of the aformentioned range listed, please consider follow up with your Primary Care Provider.   We have sent the following medications to your pharmacy for you to pick up at your convenience:  Restart Humira 27m subcutaneously on Saturday then every 14 days.  Prednisone decrease to 355mby mouth on Saturday 3/30 then in 2 weeks decrease to 2034maily until your next office visit.   Bentyl 20 by mouth 1 hour before meals.

## 2017-06-21 NOTE — Progress Notes (Signed)
Assessment and plan reviewed. Agree with CT enterography. She has fixed stenotic disease she may require surgery ultimately

## 2017-06-22 LAB — ADALIMUMAB LEVEL AND ANTIBODY
Adalimumab Drug Level: 1.9 ug/mL
Anti-Adalimumab Antibody: 78 ng/mL

## 2017-06-24 ENCOUNTER — Inpatient Hospital Stay: Payer: Managed Care, Other (non HMO) | Admitting: Internal Medicine

## 2017-06-27 ENCOUNTER — Other Ambulatory Visit: Payer: Self-pay

## 2017-06-27 DIAGNOSIS — K50018 Crohn's disease of small intestine with other complication: Secondary | ICD-10-CM

## 2017-06-27 NOTE — Progress Notes (Signed)
Patient last had screening January 2018. Agrees to come in for lab. Order entered in [place].

## 2017-07-03 ENCOUNTER — Other Ambulatory Visit: Payer: Self-pay

## 2017-07-03 ENCOUNTER — Telehealth: Payer: Self-pay

## 2017-07-03 DIAGNOSIS — K50018 Crohn's disease of small intestine with other complication: Secondary | ICD-10-CM

## 2017-07-03 MED ORDER — USTEKINUMAB 90 MG/ML ~~LOC~~ SOSY: 90.0000 mg | PREFILLED_SYRINGE | SUBCUTANEOUS | 6 refills | Status: DC

## 2017-07-03 NOTE — Telephone Encounter (Signed)
Scheduled for 07/11/17 with [facility] for the Stelara infusion.  What is the order for the Stelara injections?

## 2017-07-03 NOTE — Telephone Encounter (Signed)
Patient scheduled. She will call [facility] and arrange to her schedule.

## 2017-07-03 NOTE — Telephone Encounter (Signed)
You may schedule infusion if patient agrees to proceed. As far as CT enterography, I am not familiar with that imaging center. We MUST get a copy of that report when available. Thank you

## 2017-07-03 NOTE — Telephone Encounter (Signed)
Patient is aware. Stelara is covered at 75% of the cost after her deductible is met. She is going to look into the discount card which can be obtained and activated on-line. She will get the CT enterography at [facility]. (She works for [employer]). Her follow appointment here is 07/31/17. Do I schedule the infusion of Stelara?

## 2017-07-03 NOTE — Telephone Encounter (Signed)
-----   Message from Alfredia Ferguson, PA-C sent at 06/25/2017  3:53 PM EDT ----- Eustaquio Maize - please call pt and let her know she has developed Ab to Humira - she knows we were concerned .Marland Kitchen She can stop Humira. Stay on steroids as  We had discussed - very slowly tapering Please initiate approval for Stelara Please schedule he for Ct enterography last week of April

## 2017-07-03 NOTE — Telephone Encounter (Signed)
Amy can help with that. Thanks

## 2017-07-04 ENCOUNTER — Other Ambulatory Visit: Payer: Managed Care, Other (non HMO)

## 2017-07-04 DIAGNOSIS — K50018 Crohn's disease of small intestine with other complication: Secondary | ICD-10-CM

## 2017-07-04 NOTE — Telephone Encounter (Signed)
Please schedule induction dose of 390 mg IV once then start 90 mg subqq 8 weeks (first dose 8 weeks after IV induction ) Please schedule her follow up with me or Dr Henrene Pastor if not already scheduled - can be a month

## 2017-07-05 LAB — QUANTIFERON-TB GOLD PLUS
Mitogen-NIL: 3.04 IU/mL
NIL: 0.02 [IU]/mL
QUANTIFERON-TB GOLD PLUS: NEGATIVE
TB1-NIL: 0 IU/mL
TB2-NIL: 0 [IU]/mL

## 2017-07-08 ENCOUNTER — Telehealth: Payer: Self-pay

## 2017-07-08 ENCOUNTER — Telehealth: Payer: Self-pay | Admitting: Internal Medicine

## 2017-07-08 ENCOUNTER — Encounter: Payer: Self-pay | Admitting: Internal Medicine

## 2017-07-08 NOTE — Telephone Encounter (Signed)
  Last seen in office 06/18/17 with Dr Regis Bill Cognitive issues are new since last OV - pt is requesting a CT head for this.  New Anxiety issues from Prednisone and difficulty sleeping.  FYI Prednisone is being prescribed by GI  Not sure this CT head can be "added" on to her CT pelvic or done same day.  May need office visit with Dr Regis Bill to discuss new issues and further treatment.   Please advise Dr Regis Bill, thanks.

## 2017-07-08 NOTE — Telephone Encounter (Signed)
PA required for Stelara injections. PA started through "Cover My Meds"

## 2017-07-08 NOTE — Telephone Encounter (Signed)
OV  To  Disc new problem  To decide next step .  May need neuro to help [place] .   Please have the gi team advise on this also because they are prescribing prednisone  medication .

## 2017-07-08 NOTE — Telephone Encounter (Signed)
Copied from [place] [phone]. Topic: Quick Communication - See Telephone Encounter >> Jul 08, 2017 12:08 PM Neva Seat wrote: Pt has CT next week - also wants a CT of head ordered since she is having issues with forgetfulness, jumbled thoughts and reading comprehension.  The steroids pt has been taking is also causing dificulty w/ sleep, nerve issues and anxiety .  Pt asking if Klonopin can be prescribed to help.

## 2017-07-08 NOTE — Telephone Encounter (Signed)
Error

## 2017-07-09 ENCOUNTER — Other Ambulatory Visit: Payer: Self-pay

## 2017-07-09 ENCOUNTER — Telehealth: Payer: Self-pay

## 2017-07-09 ENCOUNTER — Telehealth: Payer: Self-pay | Admitting: Internal Medicine

## 2017-07-09 MED ORDER — CERTOLIZUMAB PEGOL 2 X 200 MG/ML ~~LOC~~ KIT: PACK | SUBCUTANEOUS | 11 refills | Status: DC

## 2017-07-09 MED ORDER — CERTOLIZUMAB PEGOL 6 X 200 MG/ML ~~LOC~~ KIT: PACK | SUBCUTANEOUS | 0 refills | Status: AC

## 2017-07-09 NOTE — Telephone Encounter (Signed)
I thought she had been approved for Stelara, and that why we needed dosing guide!Marland Kitchen.  Please find out if her insurance will cover /Approve Remicade or Cimzia   Stay on 20 mg prednisone daily   JP - would you also want to consider starting 6MP or azathioprine for this pt?

## 2017-07-09 NOTE — Telephone Encounter (Signed)
Sorry Vaughan Basta! I do not know the answer to this question!

## 2017-07-09 NOTE — Telephone Encounter (Signed)
Noted.  Will send back to Dr Regis Bill. As FYI.   Possibly see her next week?

## 2017-07-09 NOTE — Telephone Encounter (Signed)
Patient is advised to decrease the Prednisone to 20 mg daily. Take the prednisone in the mornings.

## 2017-07-09 NOTE — Telephone Encounter (Signed)
Patient called back and stated that she would not be able to make it to this appointment being held for her.

## 2017-07-09 NOTE — Telephone Encounter (Signed)
Can work her in  Any time  But not on Monday   30 minutes    Can use the last  Visit of the day sda    If needed

## 2017-07-09 NOTE — Telephone Encounter (Signed)
Patient states she is returning phone call to [facility] best call back [phone].

## 2017-07-09 NOTE — Telephone Encounter (Signed)
The Stelara infusion was approved. It's a 2 step process because it is infusion then injection. I have spoken with [facility]. Started a PA on Cimzia.

## 2017-07-09 NOTE — Telephone Encounter (Signed)
Spoke with patient and she is going to talk to her office manager about coming in tomorrow at 4pm for an OV with Dr Regis Bill to discuss new issues. Patient will call back. Appt slot held for 07/10/17 at 4pm  Will await call back to schedule.

## 2017-07-09 NOTE — Telephone Encounter (Signed)
Amy please see the note below and advise.

## 2017-07-09 NOTE — Telephone Encounter (Signed)
Cimzia starter kit approved. Fax received.

## 2017-07-09 NOTE — Telephone Encounter (Signed)
I have called [employer] [facility] again. The customer service rep tells me the Magnus Sinning has already been denied. The question is if the patient is using Cimzia in conjunction with another biologic. She is not. Again answered the question that she has failed Humira and is correctly being given prednisone.  Resubmitted the PA on Cimzia answering that question which was not asked the first time. Otherwise the information is the exact same as the PA on Stelara and the first PA on Cimzia.Marland Kitchen

## 2017-07-09 NOTE — Telephone Encounter (Signed)
Patient advised to decrease the prednisone to 20 mg daily.

## 2017-07-09 NOTE — Telephone Encounter (Signed)
Stelara injections are denied by the insurance. Patient notified to postpone the infusion. Do you want to appeal or change treatment. Patient reports mild increase in abdominal discomfort and "swollen" feeling. She is presently on Prednisone 30 mg daily. She states she really doesn't want to increase the prednisone. Please advise.

## 2017-07-10 NOTE — Telephone Encounter (Signed)
Left detailed message to call back if wanting to schedule OV Pt was pretty adamant yesterday when we spoke that she did not want to schedule an appt but we are trying to accommodate the best that we can to her schedule with work. Pt mentioned that she may have to find a different PCP if she cannot get what she needs from our office. I advised again on the voicemail today that we will try to accommodate the best that we can.   Will close encounter and a new message will be created if she calls back.

## 2017-07-12 ENCOUNTER — Inpatient Hospital Stay (HOSPITAL_COMMUNITY): Admission: RE | Admit: 2017-07-12 | Payer: Managed Care, Other (non HMO) | Source: Ambulatory Visit

## 2017-07-18 ENCOUNTER — Telehealth: Payer: Self-pay | Admitting: Physician Assistant

## 2017-07-18 NOTE — Telephone Encounter (Signed)
Cimzia will be $400+ monthly. The patient has applied for the "savings card." Therefore she has not started Cimzia. However, she is "miserable" on prednisone. She cannot sleep and she reports feeling very moody. She has been written up at work. She wants to do something different while waiting to start Cimzia. Suggestions?

## 2017-07-19 ENCOUNTER — Other Ambulatory Visit: Payer: Self-pay

## 2017-07-19 MED ORDER — BUDESONIDE 3 MG PO CPEP: 9.0000 mg | ORAL_CAPSULE | Freq: Every day | ORAL | 2 refills | Status: AC

## 2017-07-19 NOTE — Telephone Encounter (Signed)
Lets try switching her to Entocort 9 mg daily(  Or Budesonide 9 mg po daily) instead of prednisone

## 2017-07-19 NOTE — Telephone Encounter (Signed)
Patient agrees to this. rx to [facility]. Left a message asking if they would call if the Budesonide required a PA. Patient says she had her CT today. They were unable to start an IV and she had a CT without IV contrast. She was told we would have the report today. She is also asking to be called with that information.

## 2017-07-21 ENCOUNTER — Emergency Department (HOSPITAL_BASED_OUTPATIENT_CLINIC_OR_DEPARTMENT_OTHER)
Admission: EM | Admit: 2017-07-21 | Discharge: 2017-07-21 | Disposition: A | Discharge status: Home / Self Care | Payer: Managed Care, Other (non HMO) | Attending: Emergency Medicine | Admitting: Emergency Medicine

## 2017-07-21 ENCOUNTER — Other Ambulatory Visit: Payer: Self-pay

## 2017-07-21 ENCOUNTER — Encounter (HOSPITAL_BASED_OUTPATIENT_CLINIC_OR_DEPARTMENT_OTHER): Payer: Self-pay | Admitting: *Deleted

## 2017-07-21 DIAGNOSIS — Z79899 Other long term (current) drug therapy: Secondary | ICD-10-CM | POA: Diagnosis not present

## 2017-07-21 DIAGNOSIS — L02215 Cutaneous abscess of perineum: Secondary | ICD-10-CM | POA: Insufficient documentation

## 2017-07-21 DIAGNOSIS — L0291 Cutaneous abscess, unspecified: Secondary | ICD-10-CM

## 2017-07-21 DIAGNOSIS — I10 Essential (primary) hypertension: Secondary | ICD-10-CM | POA: Insufficient documentation

## 2017-07-21 LAB — CBC WITH DIFFERENTIAL/PLATELET
BASOS PCT: 0 %
Basophils Absolute: 0 10*3/uL (ref 0.0–0.1)
EOS PCT: 0 %
Eosinophils Absolute: 0 10*3/uL (ref 0.0–0.7)
HCT: 40.1 % (ref 36.0–46.0)
Hemoglobin: 13.5 g/dL (ref 12.0–15.0)
LYMPHS PCT: 11 %
Lymphs Abs: 1.5 10*3/uL (ref 0.7–4.0)
MCH: 23 pg — AB (ref 26.0–34.0)
MCHC: 33.7 g/dL (ref 30.0–36.0)
MCV: 68.4 fL — AB (ref 78.0–100.0)
Monocytes Absolute: 1.1 10*3/uL — ABNORMAL HIGH (ref 0.1–1.0)
Monocytes Relative: 8 %
NEUTROS ABS: 11.4 10*3/uL — AB (ref 1.7–7.7)
NEUTROS PCT: 81 %
Platelets: 390 10*3/uL (ref 150–400)
RBC: 5.86 MIL/uL — ABNORMAL HIGH (ref 3.87–5.11)
RDW: 18.4 % — ABNORMAL HIGH (ref 11.5–15.5)
WBC: 14 10*3/uL — ABNORMAL HIGH (ref 4.0–10.5)

## 2017-07-21 LAB — COMPREHENSIVE METABOLIC PANEL
ALK PHOS: 61 U/L (ref 38–126)
ALT: 14 U/L (ref 14–54)
ANION GAP: 9 (ref 5–15)
AST: 23 U/L (ref 15–41)
Albumin: 3.5 g/dL (ref 3.5–5.0)
BUN: 8 mg/dL (ref 6–20)
CALCIUM: 8.6 mg/dL — AB (ref 8.9–10.3)
CHLORIDE: 105 mmol/L (ref 101–111)
CO2: 20 mmol/L — ABNORMAL LOW (ref 22–32)
CREATININE: 0.61 mg/dL (ref 0.44–1.00)
GFR calc non Af Amer: >60 mL/min (ref >60)
GLUCOSE: 117 mg/dL — AB (ref 65–99)
Potassium: 3.5 mmol/L (ref 3.5–5.1)
SODIUM: 134 mmol/L — AB (ref 135–145)
Total Bilirubin: 0.6 mg/dL (ref 0.3–1.2)
Total Protein: 7 g/dL (ref 6.5–8.1)

## 2017-07-21 LAB — I-STAT CG4 LACTIC ACID, ED: Lactic Acid, Venous: 1.7 mmol/L (ref 0.5–1.9)

## 2017-07-21 MED ORDER — METRONIDAZOLE 500 MG PO TABS: 500.0000 mg | ORAL_TABLET | Freq: Two times a day (BID) | ORAL | 0 refills | Status: AC

## 2017-07-21 MED ORDER — METRONIDAZOLE 500 MG PO TABS
500.0000 mg | ORAL_TABLET | Freq: Once | ORAL | Status: AC
  Administered 2017-07-21: 500 mg via ORAL
  Filled 2017-07-21: qty 1

## 2017-07-21 MED ORDER — LIDOCAINE-EPINEPHRINE (PF) 2 %-1:200000 IJ SOLN
10.0000 mL | Freq: Once | INTRAMUSCULAR | Status: AC
  Administered 2017-07-21: 10 mL
  Filled 2017-07-21: qty 10

## 2017-07-21 MED ORDER — DOXYCYCLINE HYCLATE 100 MG PO CAPS: 100.0000 mg | ORAL_CAPSULE | Freq: Two times a day (BID) | ORAL | 0 refills | Status: DC

## 2017-07-21 MED ORDER — HYDROCODONE-ACETAMINOPHEN 5-325 MG PO TABS: 1.0000 | ORAL_TABLET | ORAL | 0 refills | Status: DC | PRN

## 2017-07-21 MED ORDER — SODIUM CHLORIDE 0.9 % IV BOLUS
1000.0000 mL | Freq: Once | INTRAVENOUS | Status: AC
  Administered 2017-07-21: 1000 mL via INTRAVENOUS

## 2017-07-21 MED ORDER — CIPROFLOXACIN HCL 500 MG PO TABS
500.0000 mg | ORAL_TABLET | Freq: Once | ORAL | Status: AC
  Administered 2017-07-21: 500 mg via ORAL
  Filled 2017-07-21: qty 1

## 2017-07-21 MED ORDER — CIPROFLOXACIN HCL 500 MG PO TABS: 500.0000 mg | ORAL_TABLET | Freq: Two times a day (BID) | ORAL | 0 refills | Status: AC

## 2017-07-21 NOTE — ED Notes (Signed)
Assisted PA with I&D of abscess. Pt tolerated well.

## 2017-07-21 NOTE — ED Notes (Signed)
Pt mentions at dc that she had a CT abd/pelvis done on Friday at [place]. RN made EDP aware of that and pts VS. New orders given.

## 2017-07-21 NOTE — Discharge Instructions (Addendum)
You may remove the bandage after 24 hours.  The only reason to replace the bandage is to protect clothing from drainage. Bandages, if used, should be replaced daily or whenever soiled. The wound may continue to drain for the next 2-3 days.   Clean the wound and surrounding area gently with tap water and mild soap. Rinse well and blot dry. You may shower normally. Soaking the wound in Epsom salt baths for no more than 15 minutes once a day may help rinse out any remaining pus and help with wound healing.  Clean the wound daily to prevent further infection. Do not use cleaners such as hydrogen peroxide or alcohol.   Please take all of your antibiotics until finished!   You may develop abdominal discomfort or diarrhea from the antibiotic.  You may help offset this with probiotics which you can buy or get in yogurt. Do not eat or take the probiotics until 2 hours after your antibiotic.   Scar reduction: Application of a topical antibiotic ointment, such as Neosporin, after the wound has begun to close and heal well can decrease scab formation and reduce scarring. After the wound has healed, application of ointments such as Aquaphor can also reduce scar formation.  The key to scar reduction is keeping the skin well hydrated and supple. Drinking plenty of water throughout the day (At least eight 8oz glasses of water a day) is essential to staying well hydrated.  Pain:  Antiinflammatory medications: Take 600 mg of ibuprofen every 6 hours or 440 mg (over the counter dose) to 500 mg (prescription dose) of naproxen every 12 hours for the next 3 days. After this time, these medications may be used as needed for pain. Take these medications with food to avoid upset stomach. Choose only one of these medications, do not take them together. Tylenol: Should you continue to have additional pain while taking the ibuprofen or naproxen, you may add in tylenol as needed. Your daily total maximum amount of tylenol from all  sources should be limited to 4042m/day for persons without liver problems, or 2003mday for those with liver problems. Vicodin: May take Vicodin as needed for severe pain.  Do not drive or perform other dangerous activities while taking the Vicodin.  Please note that each pill of Vicodin contains 325 mg of Tylenol and the above dosage limits apply.  Follow up: Follow-up in 2 days for a wound check to assure proper healing. Follow-up with general surgery and/or the gastroenterologist as soon as possible on this issue.  Call the gastroenterologist office tomorrow morning.  There is a question of the depth of this abscess.  Return to the ED sooner should signs of worsening infection arise, such as spreading redness, worsening puffiness/swelling, severe increase in pain, fever over 100.44F, or any other major issues.

## 2017-07-21 NOTE — ED Triage Notes (Signed)
Abscess to right buttock x 6 days. Denies fever
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
## 2017-07-22 ENCOUNTER — Telehealth: Payer: Self-pay | Admitting: Physician Assistant

## 2017-07-22 NOTE — Telephone Encounter (Signed)
Amy, could you possibly speak with her? She seems very unhappy with nursing right now. Pam has spoken with her also. She had an ER visit. See the notes. She wanted [place] to remove the packing. She feels we (the nurses) are unsympathetic.

## 2017-07-22 NOTE — Telephone Encounter (Signed)
See previous note. OV with Dr Henrene Pastor 07/31/17

## 2017-07-22 NOTE — Telephone Encounter (Signed)
[name], Patient is asking to speak directly to you.

## 2017-07-22 NOTE — Telephone Encounter (Signed)
Hospital follow-up,  See ED note.She has an appointment 07/31/17 with Dr Henrene Pastor.

## 2017-07-22 NOTE — Telephone Encounter (Signed)
Patient states she was seen at the ED yesterday 4.28.19 and was told by the doctor on call that someone would be in touch to get her seen sooner. Please advise. Pt last seen by APP Amy.

## 2017-07-23 ENCOUNTER — Telehealth: Payer: Self-pay | Admitting: Physician Assistant

## 2017-07-23 ENCOUNTER — Other Ambulatory Visit: Payer: Managed Care, Other (non HMO)

## 2017-07-23 ENCOUNTER — Telehealth: Payer: Self-pay | Admitting: *Deleted

## 2017-07-23 NOTE — Telephone Encounter (Signed)
See phone note from today for additional details

## 2017-07-23 NOTE — Telephone Encounter (Signed)
Just saw this note- Barbera Setters will speak to her this am , and we will formulate a plan

## 2017-07-23 NOTE — Telephone Encounter (Signed)
Received fax from [facility] at [employer] [employer].  Called [facility] and tried to do the PA that this form stated I needed to do.  When I called the 800 number, I was told I had to call the Patient's insurance company with [facility].  I called the pharmacy and the pharmacist apologized that I even got this form. The insurance was not even run. He ran it and this medication does not even need a prior authorization.  He is getting the medication ready for the patient to pick up.  Barb Merino RN is notifying the patient today 07-23-2017.

## 2017-07-23 NOTE — Telephone Encounter (Signed)
Noted. She needs to understand that her issue with abscess is surgical. Also, she wished to transfer from her previous GI practice in [place] to this GI practice after seeing her as an unassigned patient in the hospital. We accepted her in transfer and arranged timely office follow-up with Amy Esterwood PA-C. We are doing the best we can to accommodate her needs (the most recent of which was a new problem and surgical) and care for her. If she is unsatisfied with her care here, she is always welcome to return to her previous GI team or seek care elsewhere including tertiary care.

## 2017-07-23 NOTE — Telephone Encounter (Signed)
Hi Sheri, could you please call this pt, she would like to speak with the nurse manager. Thank you.

## 2017-07-23 NOTE — Telephone Encounter (Signed)
I spoke with the patient and she is upset about delay in care.  She was seen in the ED for rectal pain and abscess.  She had I &D of abscess with packing.  She was prescribed antibiotics cipro/flagyl and instructed to follow up with GI for packing removal. Patient called yesterday and she states she was asked by the nurse why "she can't remove the packing herself".  She is upset about lack of follow up from our office.   I discussed with Amy Esterwood PA  1. Patient to hold Cimzia for now (patient has not picked up due to cost).  2. Should be on budesonide 9 mg.  She is notified to pick up today and get started 3. Continue cipro/flagyl as ordered by ED 4. Obtain urgent appt with [facility] - patient notified of appointment today with Dr. Marcello Moores and to arrive at 3:00 5. Referral to Dr. Drue Flirt at [facility] - patient will be seen 08/14/17 9:30 4th Floor cancer center at [facility]. Notes faxed to [phone]  Patient notified and aware of all the above.  I apologized for her experience.  She does want to keep her appt with Dr. Henrene Pastor on 07/31/17.  She will call back for any additional questions or concerns.

## 2017-07-24 ENCOUNTER — Telehealth: Payer: Self-pay | Admitting: *Deleted

## 2017-07-24 DIAGNOSIS — K50919 Crohn's disease, unspecified, with unspecified complications: Secondary | ICD-10-CM

## 2017-07-24 NOTE — Telephone Encounter (Signed)
Ok with me      But I see that she had a perineal abscess in the ed    She may want to see also  a  surgeon    For follow up  Who has worked with crohn's disease  ( not that you need surgery)  There are  Some  At the [facility] [place] [facility]  who specialize in colon rectal surgery .  Or others at [place] refer to dr Earlean Shawl   For Crohns    Disease as per patient request  But   Tell her  no guarantee that she  will be seen  As quickly as the appt you had with dr Henrene Pastor on may 8

## 2017-07-24 NOTE — Telephone Encounter (Signed)
Copied from [facility] [id number]. Topic: Referral - Request >> Jul 24, 2017  2:58 PM Lennox Solders wrote: Reason for CRM: pt no longer want to go to [facility]. Pt would like a referral  to go to [facility]  phone [phone] . Pt has crohn  disease

## 2017-07-25 NOTE — Telephone Encounter (Signed)
Pt is set up with Gastric Surgeon on 08/14/17 at [facility], Dr Drue Flirt  Pt still wants referral to Dr Earlean Shawl -- needs for insurance purposes.  This has been placed.  Will send to Dr Regis Bill as Juluis Rainier

## 2017-07-31 ENCOUNTER — Ambulatory Visit (INDEPENDENT_AMBULATORY_CARE_PROVIDER_SITE_OTHER): Payer: Managed Care, Other (non HMO) | Admitting: Internal Medicine

## 2017-07-31 ENCOUNTER — Telehealth: Payer: Self-pay | Admitting: Internal Medicine

## 2017-07-31 ENCOUNTER — Encounter: Payer: Self-pay | Admitting: Internal Medicine

## 2017-07-31 VITALS — BP 112/72 | HR 68 | Ht 65.0 in | Wt 162.1 lb

## 2017-07-31 DIAGNOSIS — R109 Unspecified abdominal pain: Secondary | ICD-10-CM

## 2017-07-31 DIAGNOSIS — K5090 Crohn's disease, unspecified, without complications: Secondary | ICD-10-CM

## 2017-07-31 DIAGNOSIS — K50919 Crohn's disease, unspecified, with unspecified complications: Secondary | ICD-10-CM

## 2017-07-31 DIAGNOSIS — K611 Rectal abscess: Secondary | ICD-10-CM

## 2017-07-31 MED ORDER — CIPROFLOXACIN HCL 500 MG PO TABS: 500.0000 mg | ORAL_TABLET | Freq: Two times a day (BID) | ORAL | 0 refills | Status: AC

## 2017-07-31 MED ORDER — METRONIDAZOLE 500 MG PO TABS: 500.0000 mg | ORAL_TABLET | Freq: Two times a day (BID) | ORAL | 0 refills | Status: AC
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
## 2017-07-31 NOTE — Patient Instructions (Signed)
You have been scheduled for an MRI at [facility] on 08/05/2017. Your appointment time is 9:45. Please arrive at 9:15amfor registration purposes.  In addition, if you have any metal in your body, have a pacemaker or defibrillator, please be sure to let your ordering physician know. This test typically takes 45 minutes to 1 hour to complete. Should you need to reschedule, please call [phone] to do so.  Bring your insurance card and a photo id.  We have sent the following medications to your pharmacy for you to pick up at your convenience:  Cipro, Flagyl

## 2017-07-31 NOTE — Telephone Encounter (Signed)
Pt calling wanting to know if she can also have MRI of abd done. Reports she is still having abdominal pain and wanted to know if this could be added. Please advise.

## 2017-07-31 NOTE — Addendum Note (Signed)
Addended by: Rosanne Sack R on: 07/31/2017 01:23 PM   Modules accepted: Orders

## 2017-07-31 NOTE — Telephone Encounter (Signed)
Given the limitations of previous CT, MRI of the abdomen in addition to pelvis would be fine. MRI of the abdomen for Crohn's disease and ongoing abdominal pain. MRI of the pelvis to evaluate perirectal abscess and fistula.

## 2017-07-31 NOTE — Telephone Encounter (Signed)
Pt has an appt for an MRI of pelvis on Monday she has some wants to know if MRI of abdomen can be added. Pls call her

## 2017-07-31 NOTE — Telephone Encounter (Signed)
Pt scheduled for MR of abd and pelvis on 08/06/17, pt to arrive there at 10am, scan at 10:30am. Pt aware of appt and order faxed to [facility].

## 2017-08-05 ENCOUNTER — Telehealth: Payer: Self-pay | Admitting: Physician Assistant

## 2017-08-05 ENCOUNTER — Telehealth: Payer: Self-pay

## 2017-08-05 ENCOUNTER — Telehealth: Payer: Self-pay | Admitting: Internal Medicine

## 2017-08-05 NOTE — Telephone Encounter (Signed)
Orders faxed

## 2017-08-06 ENCOUNTER — Telehealth: Payer: Self-pay | Admitting: Internal Medicine

## 2017-08-06 NOTE — Telephone Encounter (Signed)
Stelara request faxed to [facility] to see if we can get approval from her insurance company.

## 2017-08-06 NOTE — Telephone Encounter (Signed)
Patient notified and verbalized understanding.  She wants to proceed with Stelara.  I advised her that I will pass this along to Dr. Blanch Media nurse to start the process.  She understands that if approved it will not be initiated until she has clearance from Dr. Drue Flirt at [facility]. She has an appt with her next week.

## 2017-08-06 NOTE — Telephone Encounter (Signed)
Patient is wanting to start on Stelara.  She is wanting to try and get approved again for the Stelara.  She says she spoke with her insurance program and they will approve it if it is stated that she has active to moderate Crohn's and that it is medically necessary.  She also has contacted [facility] and they have a program that will cover the drug if it is denied by her insurance.    Dr. Henrene Pastor will you please advise on your choice of biologic.  What is the time frame that you would like her to start on biologics.  I explained to the patient that for now the biologics were on hold.  She wants to start as soon as possible.  She wants the process started for the prior authorization as soon as possible. Please review

## 2017-08-06 NOTE — Telephone Encounter (Signed)
Stelara would be okay. It is what we initially suggested, but moved to [place] for insurance reasons. However, she may not start biologic therapy until she is cleared by the colorectal surgeon because of a history of perirectal abscess recently. Thanks

## 2017-08-07 NOTE — Telephone Encounter (Signed)
Pt calling for MRI results. Discussed with her that it does show resolution of the perianal abscess and some thickening of the small bowel, pt to keep plans to see Dr. Drue Flirt. MRI will be scanned into epic.

## 2017-08-20 ENCOUNTER — Other Ambulatory Visit: Payer: Self-pay

## 2017-08-20 DIAGNOSIS — K5090 Crohn's disease, unspecified, without complications: Secondary | ICD-10-CM

## 2017-08-21 ENCOUNTER — Telehealth: Payer: Self-pay

## 2017-08-21 ENCOUNTER — Other Ambulatory Visit: Payer: Self-pay

## 2017-08-21 DIAGNOSIS — K50918 Crohn's disease, unspecified, with other complication: Secondary | ICD-10-CM

## 2017-08-21 NOTE — Telephone Encounter (Signed)
Pt scheduled for Stelara infusion at [facility] 08/27/17@8am . Pt aware of appt. Pt scheduled to see Dr. Henrene Pastor 10/21/17@9 :30am. Pt will be due for Stelara Injection July 30th. Appt letter mailed to pt.

## 2017-08-21 NOTE — Telephone Encounter (Signed)
----- Message from Irene Shipper, MD sent at 08/21/2017 11:54 AM EDT ----- Regarding: RE: [name] Thank you ----- Message ----- From: Algernon Huxley, RN Sent: 08/21/2017  11:19 AM To: Irene Shipper, MD Subject: RE: [name], Patient had contacted the office the middle of May and spoke with [facility] Pt wanted to do Stelara. I will get her scheduled and then set up appt with you.  Thanks, Vaughan Basta ----- Message ----- From: Irene Shipper, MD Sent: 08/21/2017  11:05 AM To: Algernon Huxley, RN Subject: RE: [name], MRI was done elsewhere, thus not in our system. I did review the report however which did not show abscess. Please fax a copy of that report over to Dr. Drue Flirt. I do believe it is okay to start biologic therapy at this time after having reviewed the MRI report and Dr. Geronimo Boot note. The patient told me that she was approved for Cimzia, which I would prefer based on her prior antibody and drug level studies. Otherwise, initiate Stelara. Let me know. The patient should follow-up with me 8 weeks after starting therapy. Thank you jp ----- Message ----- From: Algernon Huxley, RN Sent: 08/21/2017  10:50 AM To: Irene Shipper, MD Subject: [name]                                        Dr. Henrene Pastor please see note below from Dr. Drue Flirt below. Have you heard from Dr. Drue Flirt? The [name] has been approved for her, the IV dose through 08/29/17. Will have to get an extension on Stelara if not given by 08/29/17.  I agree with above documentation as it reflects my independent assessment and plan. Briefly, she is a 42 year old female with a history of Crohn's disease involving the terminal ileum and anoperineum. She, today, reports that she is feeling fairly well and is about ' 90%' back to normal. In the last few months, she has been admitted to the hospital for a small bowel obstruction with active ileal Crohn's disease, and an incision  and drainage of a perianal abscess for sepsis. She is somewhat limited with her diet, reducing acidic foods due to bloating but reports being able to tolerate fibrous foods fairly well. She reports some discomfort around her anal verge but otherwise has no drainage or significant pain. On exam, her abdomen is soft flat and nontender. She has a well-healed lower incision from a prior hysterectomy. On anal exam, her perianal him actually looks fairly healthy and it is difficult to even find the incision and drainage site from her prior abscess. There is no undrained sepsis and no scarring or fistula. I reviewed her CT imaging and she brings with her an MRI study as well that I am not able to open. The CT imaging reveals no significant evidence of bowel thickening but it is a noncontrasted CT.  I reviewed her outside records from her hospital stay and her last note from Dr. Henrene Pastor. We talked about her current  health with regard to her Crohn's disease. She is currently not taking medications but is due to be assessed for starting a new biologic agent possibly Cimzia. I reassured her that I do not see any undrained sepsis around her anus. I will try to open the MRI with our radiology team to review as well. We talked about the role of surgery in the small bowel as well as the anal perineum. We talked about determining whether her disease is primarily inflammatory, which would be best treated with medications, versus stricturing or scarring, which is best treated with surgery. We briefly discussed the specifics of surgery and the typical recovery. I will review these radiology images that I was not able to open today. We talked about the possibility of obtaining CT enterography as Dr. Henrene Pastor had wanted to do as well to further define her ileal disease. If there is no significant fibrous stenotic component, I agree with proceeding with medical therapy and I will be on standby should she have any surgical needs. She  reiterated that she wants to try to avoid surgery but I believe she feels better after we discussed surgery for Crohn's disease in general, and the case that she would need this in the future. I will try to open these images and give her a call and I will touch base with Dr. Henrene Pastor as well. Electronically signed by: Dannielle Burn, MD 08/14/2017 3:58 PM

## 2017-08-22 ENCOUNTER — Telehealth: Payer: Self-pay | Admitting: Internal Medicine

## 2017-08-22 NOTE — Telephone Encounter (Signed)
Left message for pt that she needs to call [phone] and speak with Laverne to reschedule her Stelara appt as I do not know her work schedule. Requested pt notify office of new date as new prior auth will need to be obtained.

## 2017-08-23 NOTE — Telephone Encounter (Signed)
Spoke with pt and let her know that she can reschedule the appt and gave her the number. PA will need to be moved once new appt scheduled.

## 2017-08-26 ENCOUNTER — Other Ambulatory Visit: Payer: Self-pay

## 2017-08-26 ENCOUNTER — Telehealth: Payer: Self-pay | Admitting: Internal Medicine

## 2017-08-26 LAB — HM MAMMOGRAPHY

## 2017-08-26 NOTE — Telephone Encounter (Signed)
Pt called and states over the past few days she has started having an increase in abdominal pain and cramping. She did notice some blood in her stool on Sunday. Pt wants to know if there is something stronger she can take for the cramping and pain. Pt is scheduled for Stelara infusion on 09/03/17. Please advise.

## 2017-08-26 NOTE — Telephone Encounter (Signed)
Bentyl 20 mg q 6 hrs prn

## 2017-08-26 NOTE — Telephone Encounter (Signed)
Pt rescheduled her Stelara to 09/03/17. Prior auth extended for that infusion.

## 2017-08-26 NOTE — Telephone Encounter (Signed)
Pt reports she is already taking bentyl 23m tid and it is not helping. Please advise.

## 2017-08-27 ENCOUNTER — Inpatient Hospital Stay (HOSPITAL_COMMUNITY): Admission: RE | Admit: 2017-08-27 | Payer: Managed Care, Other (non HMO) | Source: Ambulatory Visit

## 2017-08-27 NOTE — Telephone Encounter (Signed)
Spoke with pt and let her know Dr. Blanch Media comments. Pt stated she was not asking for pain medication, states she was looking for something stronger than what she already had. Let pt know that there is not anything stronger available.

## 2017-08-27 NOTE — Telephone Encounter (Signed)
There is not much else to offer aside from antispasmodics, which she is taking. Hopefully Stelara will help. I can not prescribe narcotics based pain medications

## 2017-09-03 ENCOUNTER — Ambulatory Visit (HOSPITAL_COMMUNITY)
Admission: RE | Admit: 2017-09-03 | Discharge: 2017-09-03 | Disposition: A | Discharge status: Home / Self Care | Payer: Managed Care, Other (non HMO) | Source: Ambulatory Visit | Attending: Internal Medicine | Admitting: Internal Medicine

## 2017-09-03 DIAGNOSIS — K50918 Crohn's disease, unspecified, with other complication: Secondary | ICD-10-CM | POA: Insufficient documentation

## 2017-09-03 MED ORDER — USTEKINUMAB 130 MG/26ML IV SOLN
390.0000 mg | Freq: Once | INTRAVENOUS | Status: AC
  Administered 2017-09-03: 390 mg via INTRAVENOUS
  Filled 2017-09-03: qty 78

## 2017-09-03 NOTE — Discharge Instructions (Signed)
Ustekinumab injection What is this medicine? USTEKINUMAB ([place] te KIN ue mab) is used to treat plaque psoriasis and psoriatic arthritis. This medicine is also used to treat Crohn's disease. It is not a cure. This medicine may be used for other purposes; ask your health care provider or pharmacist if you have questions. COMMON BRAND NAME(S): Stelara What should I tell my health care provider before I take this medicine? They need to know if you have any of these conditions: -cancer -diabetes -immune system problems -infection (especially a virus infection such as chickenpox, cold sores, or herpes) or history of infections -receiving or have received allergy shots -recently received or scheduled to receive a vaccine -tuberculosis, a positive skin test for tuberculosis, or have recently been in close contact with someone who has tuberculosis -an unusual reaction to ustekinumab, latex, other medicines, foods, dyes, or preservatives -pregnant or trying to get pregnant -breast-feeding How should I use this medicine? This medicine is for injection under the skin or infusion into a vein. It is usually given by a health care professional in a hospital or clinic setting. If you get this medicine at home, you will be taught how to prepare and give this medicine. Use exactly as directed. Take your medicine at regular intervals. Do not take your medicine more often than directed. It is important that you put your used needles and syringes in a special sharps container. Do not put them in a trash can. If you do not have a sharps container, call your pharmacist or healthcare provider to get one. A special MedGuide will be given to you by the pharmacist with each prescription and refill. Be sure to read this information carefully each time. Talk to your pediatrician regarding the use of this medicine in children. While this drug may be prescribed for children as young as 12 years for selected conditions,  precautions do apply. Overdosage: If you think you have taken too much of this medicine contact a poison control center or emergency room at once. NOTE: This medicine is only for you. Do not share this medicine with others. What if I miss a dose? If you miss a dose, take it as soon as you can. If it is almost time for your next dose, take only that dose. Do not take double or extra doses. What may interact with this medicine? Do not take this medicine with any of the following medications: -live virus vaccines This medicine may also interact with the following medications: -cyclosporine -immunosuppressives -vaccines -warfarin This list may not describe all possible interactions. Give your health care provider a list of all the medicines, herbs, non-prescription drugs, or dietary supplements you use. Also tell them if you smoke, drink alcohol, or use illegal drugs. Some items may interact with your medicine. What should I watch for while using this medicine? Your condition will be monitored carefully while you are receiving this medicine. Tell your doctor or healthcare professional if your symptoms do not start to get better or if they get worse. You will be tested for tuberculosis (TB) before you start this medicine. If your doctor prescribes any medicine for TB, you should start taking the TB medicine before starting this medicine. Make sure to finish the full course of TB medicine. Call your doctor or health care professional if you get a cold or other infection while receiving this medicine. Do not treat yourself. This medicine may decrease your body's ability to fight infection. Talk to your doctor about your risk of  cancer. You may be more at risk for certain types of cancers if you take this medicine. What side effects may I notice from receiving this medicine? Side effects that you should report to your doctor or health care professional as soon as possible: -allergic reactions like skin  rash, itching or hives, swelling of the face, lips, or tongue -breathing problems -changes in vision -confusion -seizures -signs and symptoms of infection like fever or chills; cough; sore throat; pain or trouble passing urine -swollen lymph nodes in the neck, underarm, or groin areas -unexplained weight loss -unusually weak or tired -vomiting Side effects that usually do not require medical attention (report to your doctor or health care professional if they continue or are bothersome): -headache -nausea -redness, itching, swelling, or bruising at site where injected This list may not describe all possible side effects. Call your doctor for medical advice about side effects. You may report side effects to FDA at 1-800-FDA-1088. Where should I keep my medicine? Keep out of the reach of children. If you are using this medicine at home, you will be instructed on how to store this medicine. Store the prefilled syringes in a refrigerator between 2 to 8 degrees C (36 to 46 degrees F). Keep in the original carton. Protect from light. Do not freeze. Do not shake. Throw away any unused medicine after the expiration date on the label. NOTE: This sheet is a summary. It may not cover all possible information. If you have questions about this medicine, talk to your doctor, pharmacist, or health care provider.  2018 Elsevier/Gold Standard (2016-01-10 09:12:47)

## 2017-09-04 ENCOUNTER — Telehealth: Payer: Self-pay | Admitting: Internal Medicine

## 2017-09-04 MED ORDER — USTEKINUMAB 90 MG/ML ~~LOC~~ SOSY: 90.0000 mg | PREFILLED_SYRINGE | SUBCUTANEOUS | 6 refills | Status: AC

## 2017-09-04 NOTE — Telephone Encounter (Signed)
Pt received stelara yesterday and will be due for first Stelara injection 10/29/17. Script sent to pharmacy for her Stelara injections, pt aware.

## 2017-09-18 ENCOUNTER — Ambulatory Visit: Payer: Managed Care, Other (non HMO) | Admitting: Internal Medicine

## 2017-09-20 ENCOUNTER — Encounter: Payer: Self-pay | Admitting: Internal Medicine

## 2017-09-20 HISTORY — PX: BIOPSY BREAST: PRO8

## 2017-09-20 HISTORY — PX: MM FINE NEEDLE ASPIRATION: HXRAD668

## 2017-09-20 LAB — HM MAMMOGRAPHY

## 2017-09-30 ENCOUNTER — Telehealth: Payer: Self-pay | Admitting: *Deleted

## 2017-09-30 ENCOUNTER — Encounter: Payer: Self-pay | Admitting: Internal Medicine

## 2017-09-30 NOTE — Telephone Encounter (Signed)
Received fax from [facility], CT Abd & Pelvis W/O contrast dated 07-19-2017. Stamped and sent to be scanned 09-30-2017.

## 2017-10-21 ENCOUNTER — Ambulatory Visit: Payer: Managed Care, Other (non HMO) | Admitting: Internal Medicine

## 2017-11-12 ENCOUNTER — Encounter: Payer: Self-pay | Admitting: Internal Medicine

## 2017-11-19 ENCOUNTER — Encounter: Payer: Self-pay | Admitting: *Deleted

## 2018-02-14 LAB — CBC AND DIFFERENTIAL
HCT: 34 — AB (ref 36–46)
Hemoglobin: 10.6 — AB (ref 12.0–16.0)
Platelets: 318 (ref 150–399)
WBC: 5.1

## 2018-03-18 ENCOUNTER — Encounter: Payer: Self-pay | Admitting: Internal Medicine

## 2018-04-03 LAB — CBC AND DIFFERENTIAL
HCT: 35 — AB (ref 36–46)
Hemoglobin: 11.4 — AB (ref 12.0–16.0)
Neutrophils Absolute: 6
Platelets: 388 (ref 150–399)
WBC: 7.5

## 2018-05-16 LAB — CBC AND DIFFERENTIAL
HEMATOCRIT: 36 (ref 36–46)
HEMOGLOBIN: 11.4 — AB (ref 12.0–16.0)
Platelets: 419 — AB (ref 150–399)
WBC: 6.5

## 2018-05-22 ENCOUNTER — Encounter: Payer: Self-pay | Admitting: Internal Medicine

## 2018-06-06 LAB — CBC AND DIFFERENTIAL
HCT: 37 (ref 36–46)
HEMOGLOBIN: 11.8 — AB (ref 12.0–16.0)
Platelets: 236 (ref 150–399)
WBC: 17.8

## 2018-06-12 ENCOUNTER — Encounter: Payer: Self-pay | Admitting: Internal Medicine

## 2018-07-18 LAB — CBC AND DIFFERENTIAL
Neutrophils Absolute: 8
WBC: 10.5

## 2018-08-14 ENCOUNTER — Encounter: Payer: Self-pay | Admitting: Internal Medicine

## 2018-08-19 ENCOUNTER — Encounter: Payer: Self-pay | Admitting: Internal Medicine

## 2018-09-03 ENCOUNTER — Other Ambulatory Visit: Payer: Self-pay | Admitting: Internal Medicine

## 2018-09-03 DIAGNOSIS — K5090 Crohn's disease, unspecified, without complications: Secondary | ICD-10-CM

## 2018-09-18 ENCOUNTER — Other Ambulatory Visit: Payer: Self-pay | Admitting: Internal Medicine

## 2018-09-19 ENCOUNTER — Ambulatory Visit
Admission: RE | Admit: 2018-09-19 | Discharge: 2018-09-19 | Disposition: A | Discharge status: Home / Self Care | Payer: Managed Care, Other (non HMO) | Source: Ambulatory Visit | Attending: Internal Medicine | Admitting: Internal Medicine

## 2018-09-19 DIAGNOSIS — K5090 Crohn's disease, unspecified, without complications: Secondary | ICD-10-CM

## 2018-09-19 MED ORDER — IOPAMIDOL (ISOVUE-300) INJECTION 61%
100.0000 mL | Freq: Once | INTRAVENOUS | Status: AC | PRN
  Administered 2018-09-19: 100 mL via INTRAVENOUS

## 2018-10-10 LAB — CBC AND DIFFERENTIAL
HCT: 39 (ref 36–46)
Hemoglobin: 12.6 (ref 12.0–16.0)
Neutrophils Absolute: 7
Platelets: 399 (ref 150–399)
WBC: 9.1

## 2018-10-23 ENCOUNTER — Encounter: Payer: Self-pay | Admitting: Internal Medicine

## 2019-03-27 IMAGING — CT CT ABD-PELV W/ CM
2 of 5 series · 15 of 46 positions shown, 17 images · IV contrast (APPLIED)
Comparison: Abdominal ultrasound dated 12/27/2014

CLINICAL DATA: 41-year-old female with abdominal pain and vomiting.

EXAM:
CT ABDOMEN AND PELVIS WITH CONTRAST
TECHNIQUE: Multidetector CT imaging of the abdomen and pelvis was performed
using the standard protocol following bolus administration of
intravenous contrast.
CONTRAST:  100mL NRBOOR-7AA IOPAMIDOL (NRBOOR-7AA) INJECTION 61%

[Series 2: axial st · axial · 0.70mm/px · z∈[-394,+31]mm · 12 of 95 slices shown, 14 images]
[im 5/95  soft-tissue]
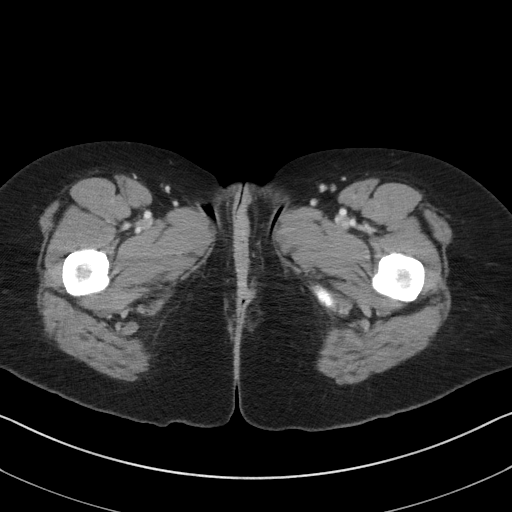
[im 5/95  bone]
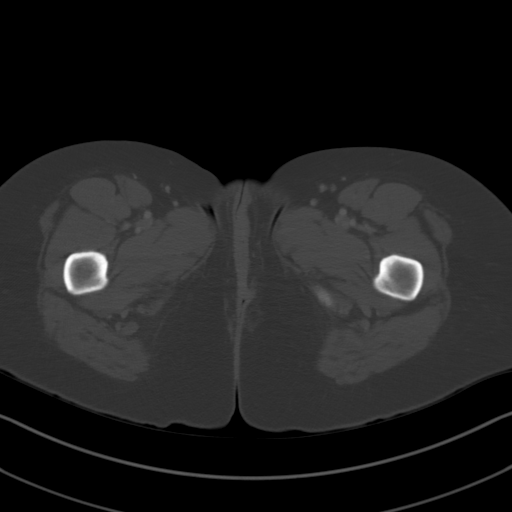
[im 15/95  soft-tissue]
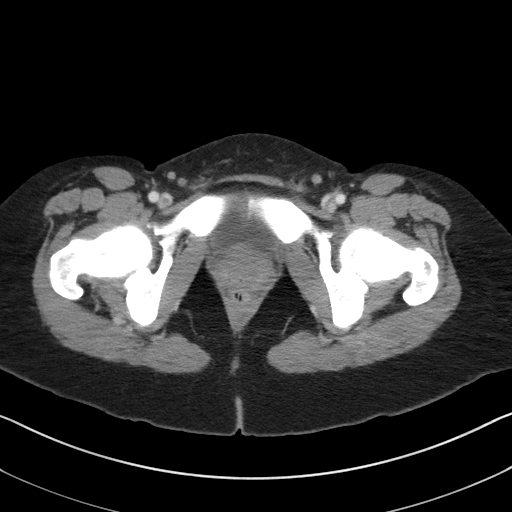
[im 20/95  soft-tissue]
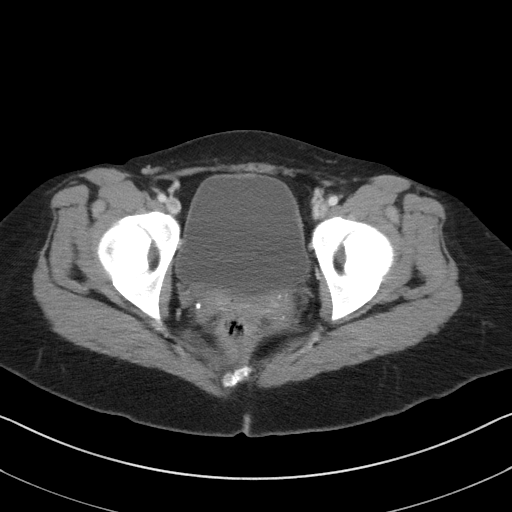
[im 30/95  soft-tissue]
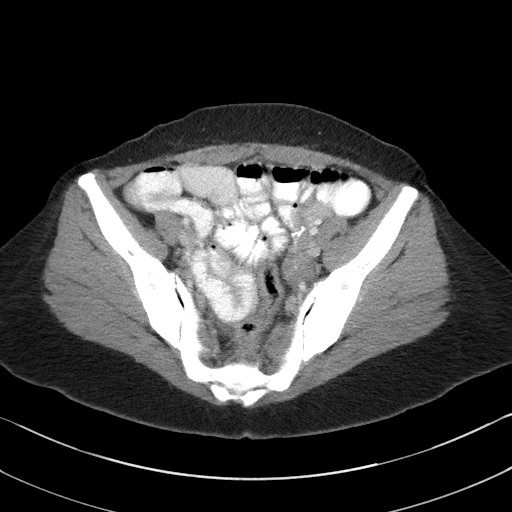
[im 35/95  soft-tissue]
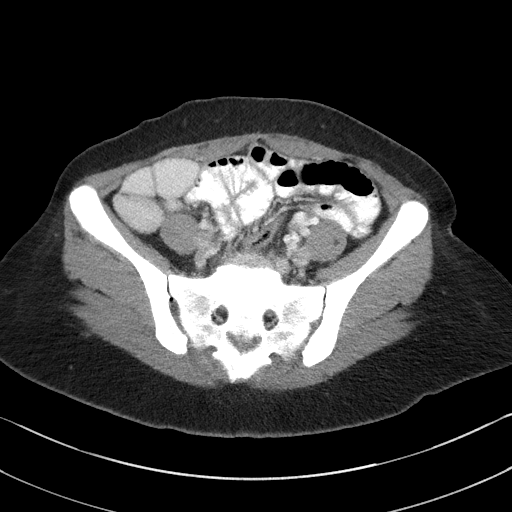
[im 45/95  soft-tissue]
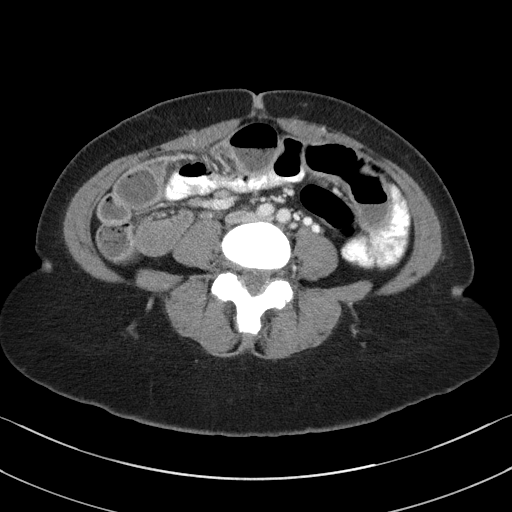
[im 50/95  soft-tissue]
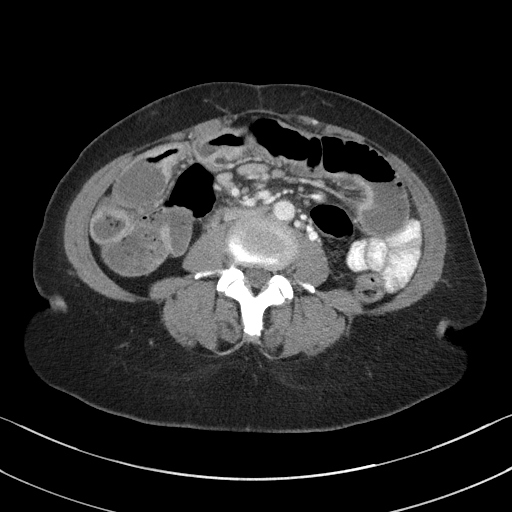
[im 60/95  soft-tissue]
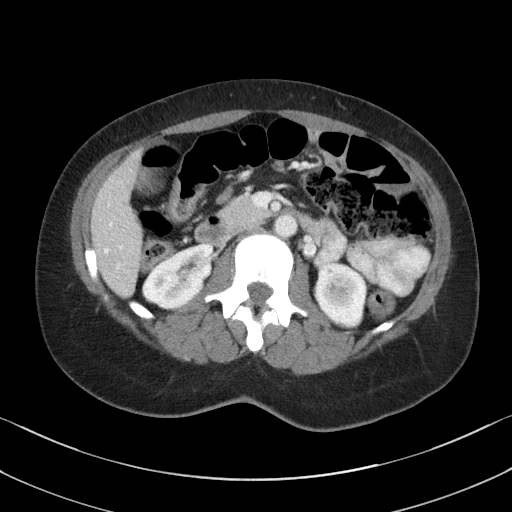
[im 65/95  soft-tissue]
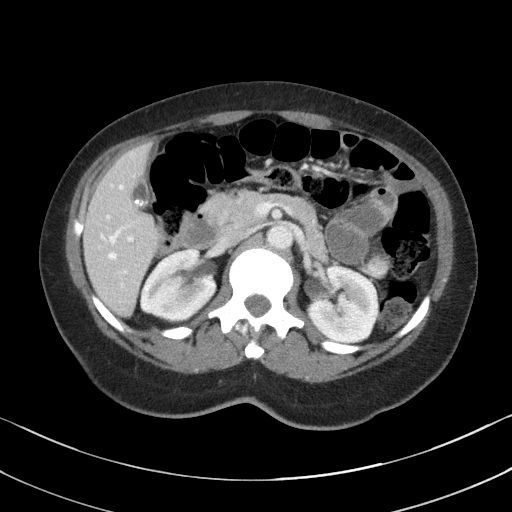
[im 65/95  bone]
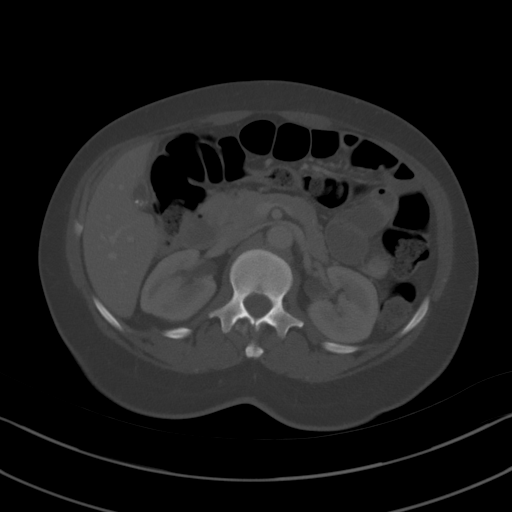
[im 75/95  soft-tissue]
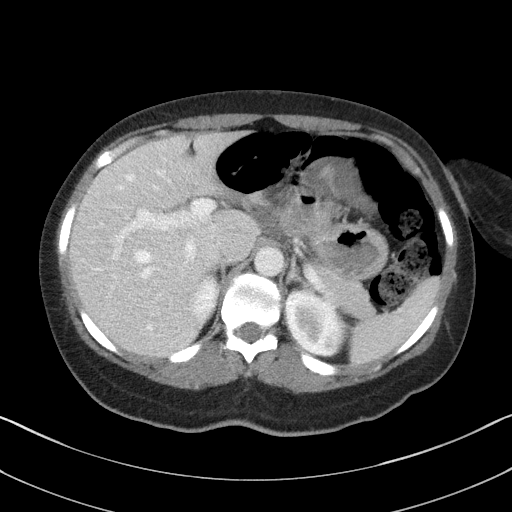
[im 80/95  soft-tissue]
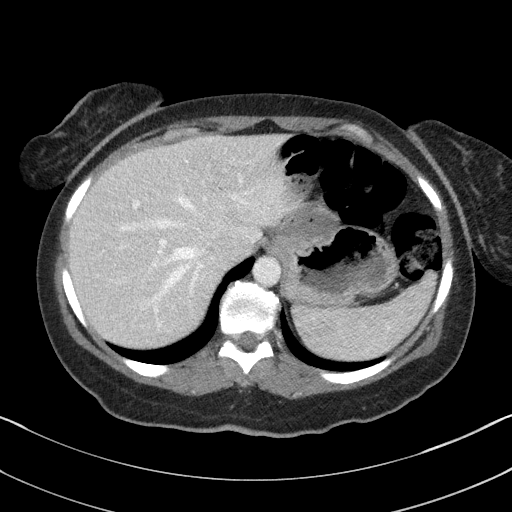
[im 90/95  soft-tissue]
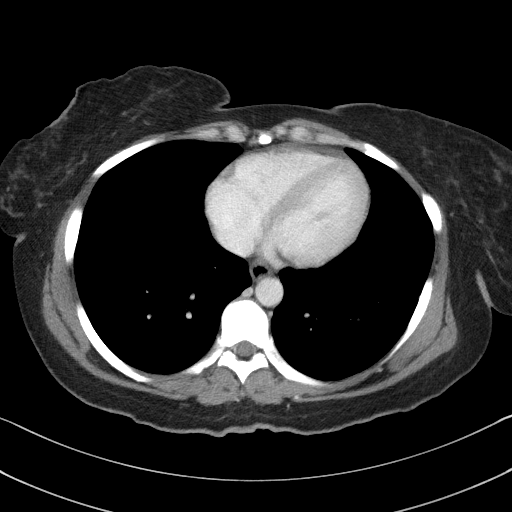

[Series 5: coronal st · coronal · 0.78mm/px · 3 of 101 slices shown]
[im 34/101  soft-tissue]
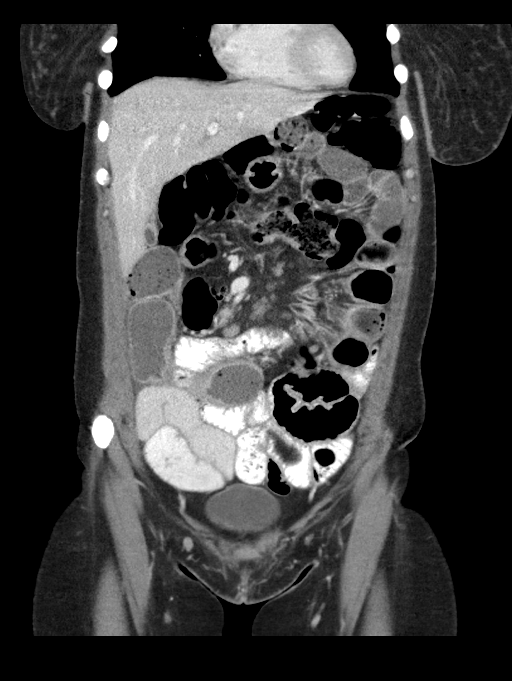
[im 45/101  soft-tissue]
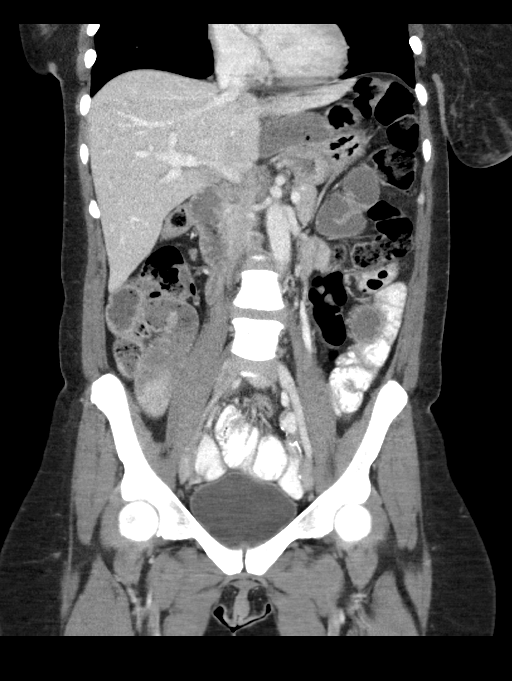
[im 56/101  soft-tissue]
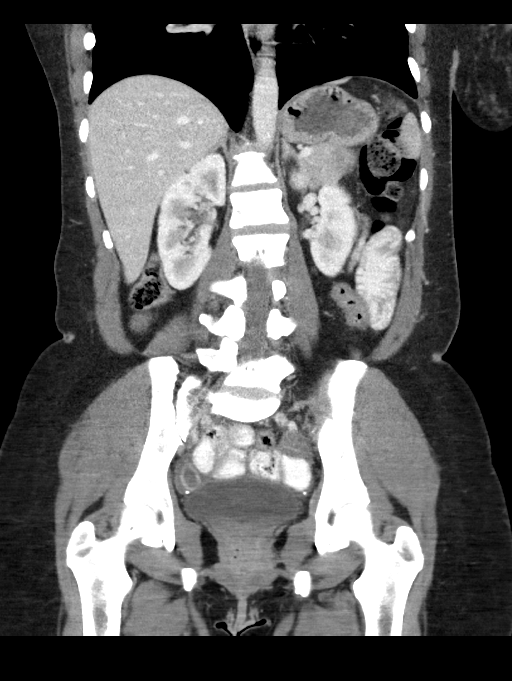

[15 of 46 positions shown; findings below may reference images not displayed]

FINDINGS: Lower chest: The visualized lung bases are clear.

No intra-abdominal free air. There is a small free fluid within the
pelvis.

Hepatobiliary: The liver is unremarkable. No intrahepatic biliary
ductal dilatation. Multiple small stones noted within the
gallbladder. No evidence of acute cholecystitis by CT. Ultrasound
may provide better evaluation if clinically indicated. Artifact
versus a tiny stone in the central CBD at the head of the pancreas
(coronal series 5, image 44 and series 2, image 31).

Pancreas: Unremarkable. No pancreatic ductal dilatation or
surrounding inflammatory changes.

Spleen: Normal in size without focal abnormality.

Adrenals/Urinary Tract: The adrenal glands are unremarkable. There
is no hydronephrosis on either side. There is symmetric enhancement
and excretion of contrast by both kidneys. The visualized ureters
and urinary bladder are unremarkable.

Stomach/Bowel: There is thickened and inflamed loops of small bowel
in the anterior mid abdomen most consistent with enteritis. There is
abutment of adjacent loops of bowel to each other and to the
anterior peritoneal wall in the mid abdomen consistent with
adhesions. There are segments with probable partial or low-grade
obstruction secondary to adhesion and inflammation for example in
the right lower quadrant (series 5, image 32, series 2, image 57)
and in the right hemiabdomen (series 2, image 43, series 5, image
27). Follow-up is recommended to ensure passage of oral contrast
into the colon. The appendix is normal.

Vascular/Lymphatic: The abdominal aorta and IVC appear unremarkable.
No portal venous gas. Top-normal mesenteric lymph nodes, likely
reactive.

Reproductive: The uterus is not visualized, likely surgically
absent. There is a 2 cm right ovarian corpus luteum. The left ovary
is unremarkable. Surgical material noted in the region of the adnexa
bilaterally.

Other: None

Musculoskeletal: No acute or significant osseous findings.
IMPRESSION: 1. Segments of inflammatory changes and thickening of the small
bowel primarily in the right hemiabdomen and right lower quadrant
most consistent with enteritis. There is associated adhesions of the
bowel loops to each other and to the anterior peritoneal wall with
probable degree of partial or low-grade obstruction. Follow-up with
radiographs recommended to ensure passage of oral contrast into the
colon.
2. Cholelithiasis.
3. A 2 cm corpus luteum in the right ovary.

## 2019-03-28 IMAGING — DX DG ABDOMEN 2V
3 series · 3 of 3 positions shown · non-contrast
Comparison: CT abdomen and pelvis June 03, 2017

CLINICAL DATA: Abdominal pain with nausea

EXAM:
ABDOMEN - 2 VIEW

[abdomen erect]
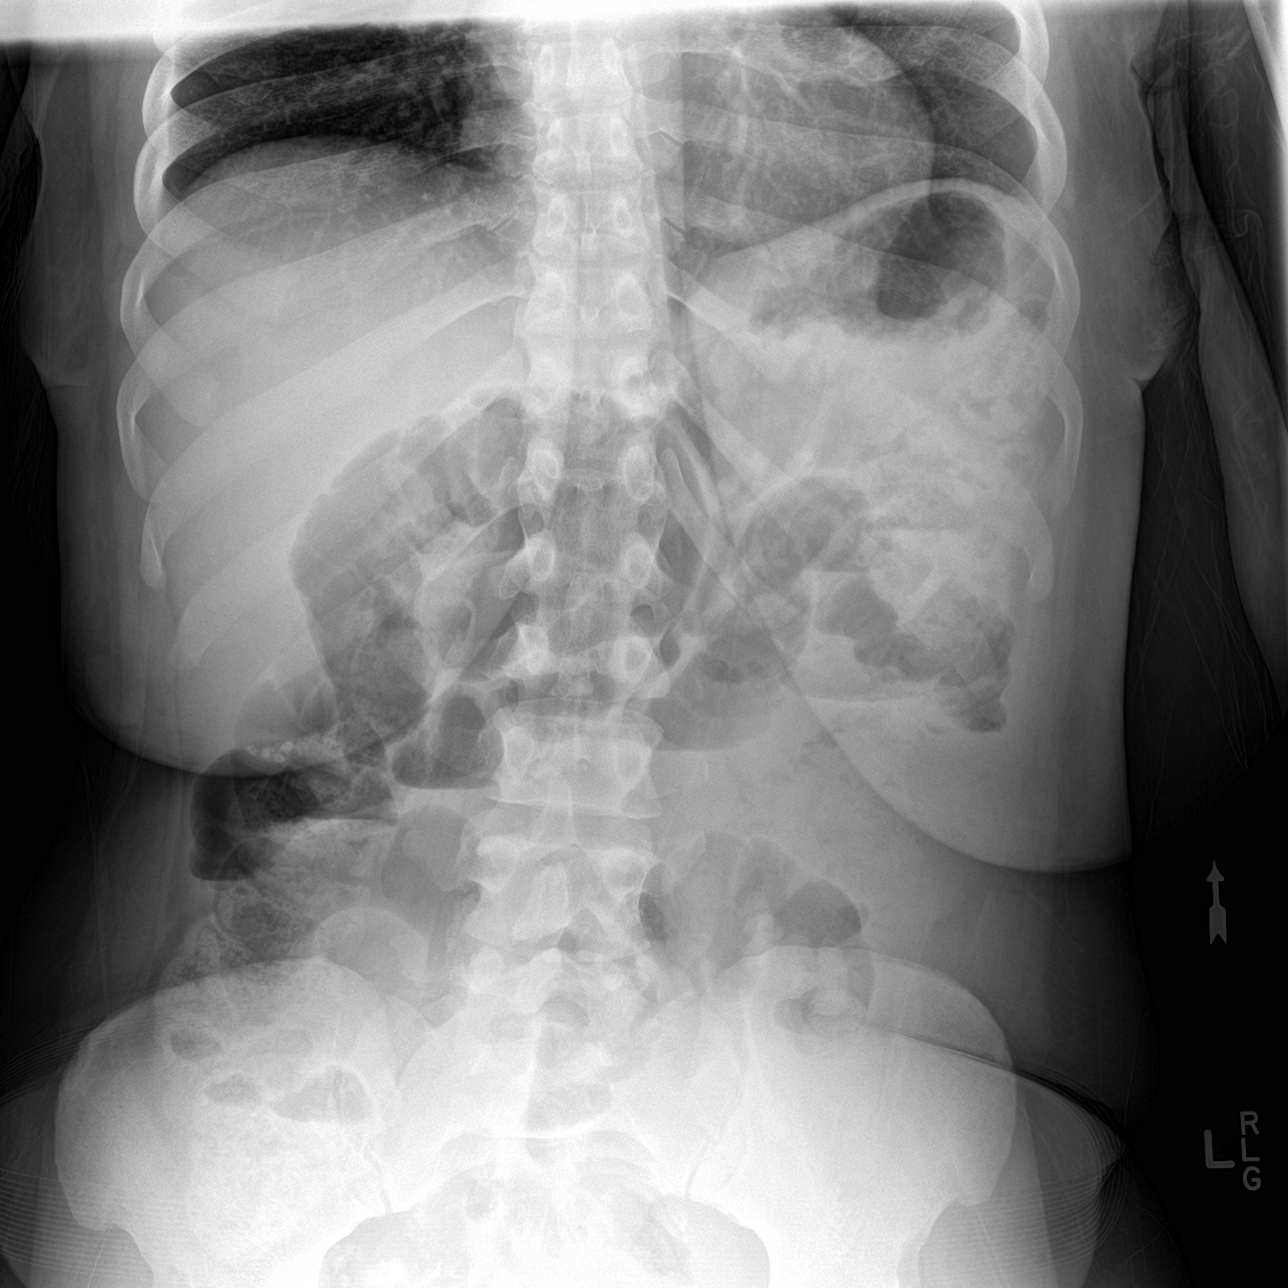

[abdomen supine (1 of 2)]
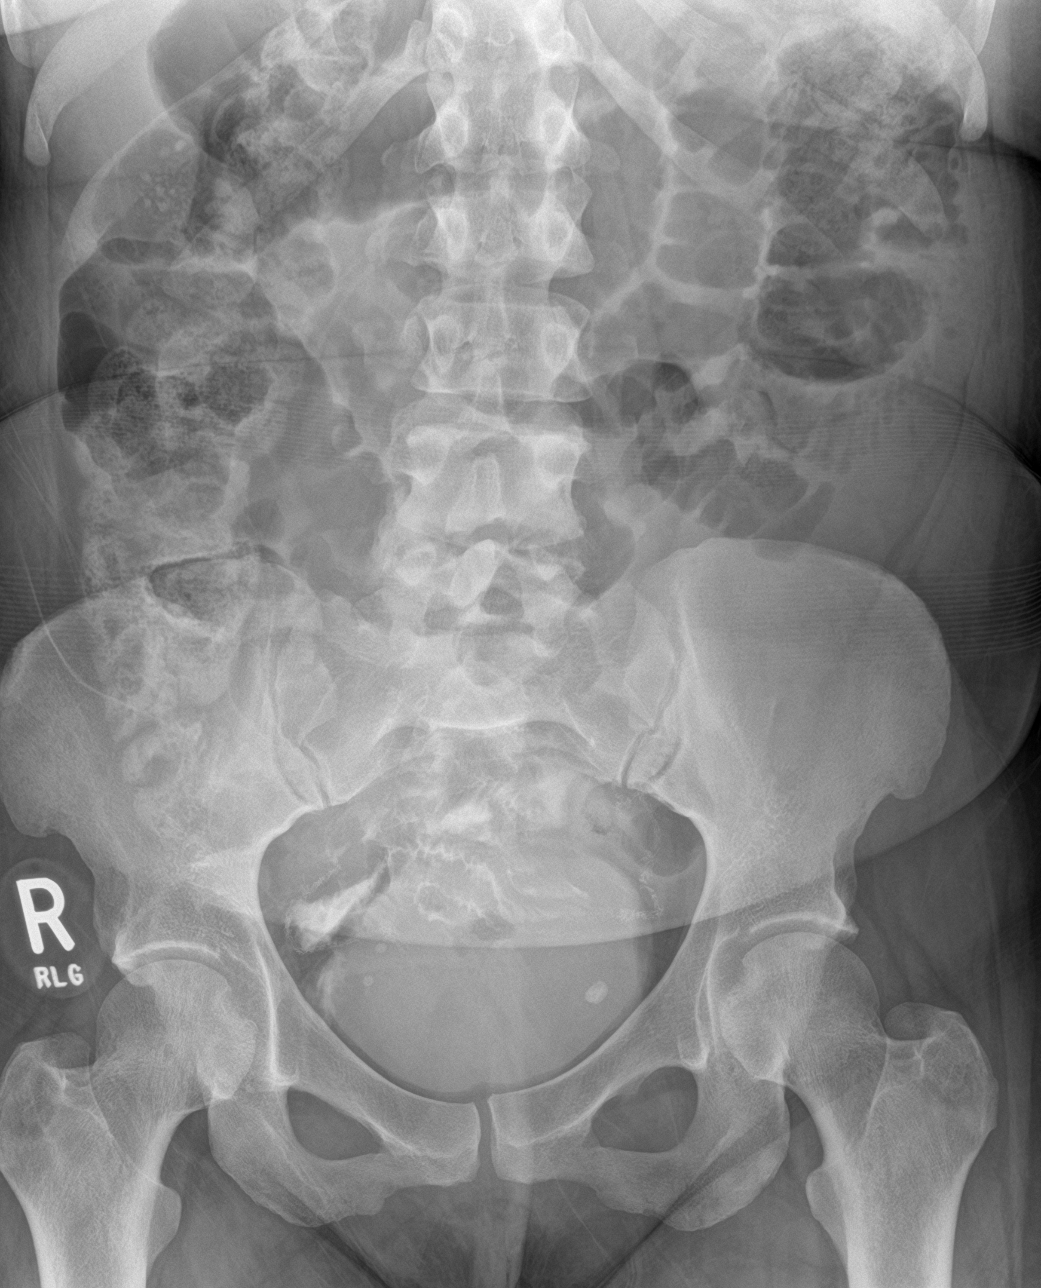

[abdomen supine (2 of 2)]
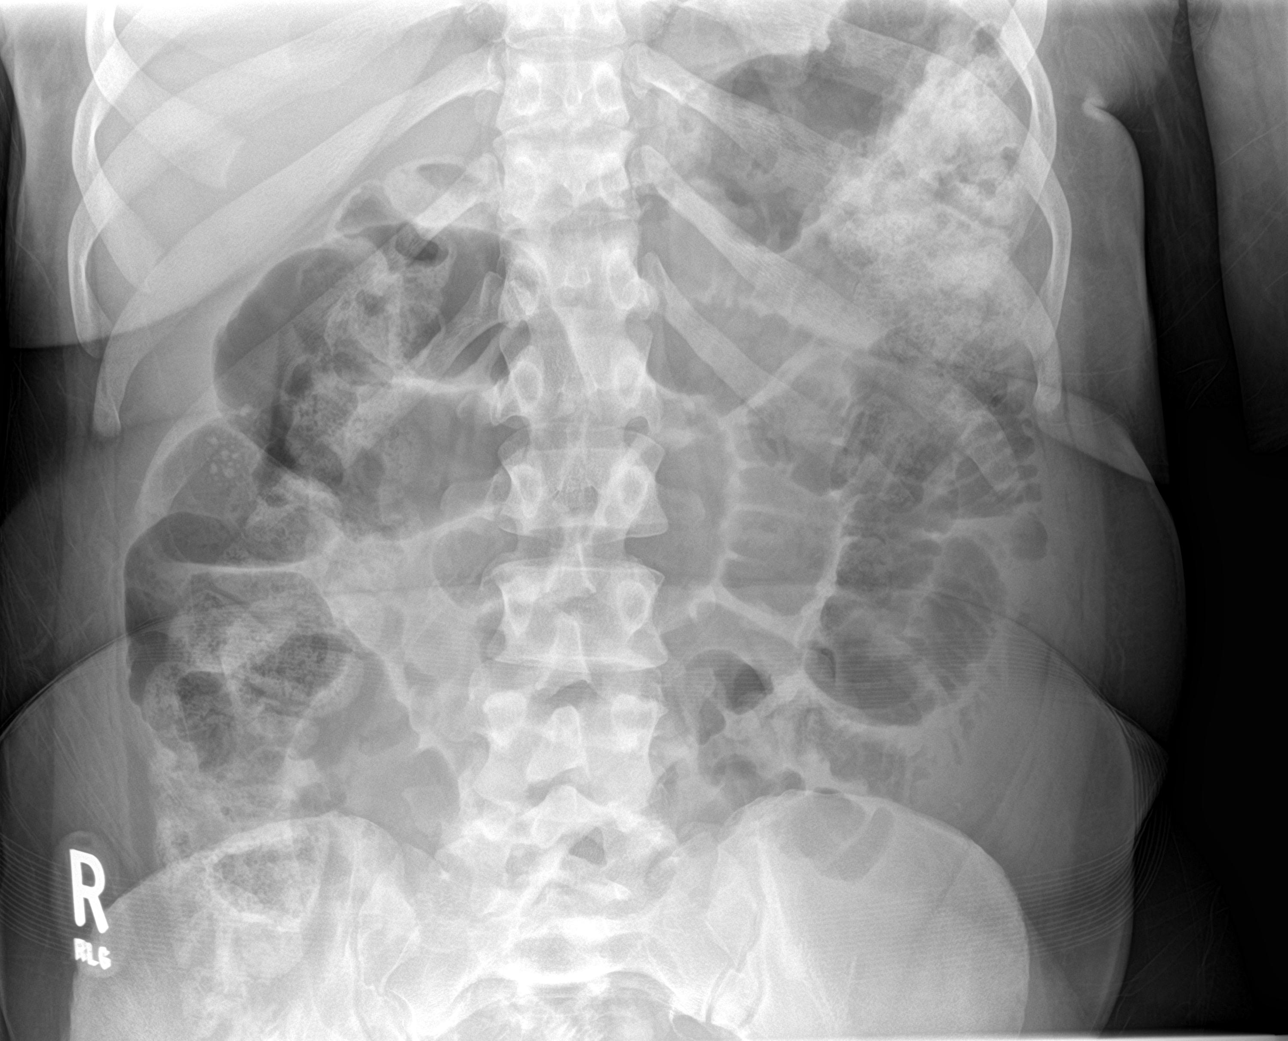

[3 of 3 positions shown; findings below may reference images not displayed]

FINDINGS: Supine and upright images obtained. There are foci of calcification
in the right upper quadrant which CT demonstrates represents small
gallstones within the gallbladder. There remains mild generalized
bowel dilatation without appreciable air-fluid levels. No free air.
There are phleboliths in the pelvis. Contrast is seen within the
urinary bladder. Lung bases are clear.
IMPRESSION: The bowel gas pattern suggests persistent ileus or enteritis. Bowel
obstruction not felt to be likely. No free air. Evidence of
cholelithiasis.

## 2019-04-10 LAB — CBC AND DIFFERENTIAL
HCT: 40 (ref 36–46)
Hemoglobin: 12.6 (ref 12.0–16.0)
Neutrophils Absolute: 9
Platelets: 417 — AB (ref 150–399)
WBC: 11.2

## 2019-04-10 LAB — CBC: RBC: 5.57 — AB (ref 3.87–5.11)

## 2019-05-30 ENCOUNTER — Encounter: Payer: Self-pay | Admitting: Internal Medicine

## 2019-12-01 LAB — HM PAP SMEAR

## 2020-09-14 ENCOUNTER — Other Ambulatory Visit: Payer: Self-pay | Admitting: Otolaryngology

## 2020-09-14 ENCOUNTER — Other Ambulatory Visit: Payer: Self-pay | Admitting: Obstetrics and Gynecology

## 2020-09-15 ENCOUNTER — Other Ambulatory Visit: Payer: Self-pay | Admitting: Otolaryngology

## 2020-09-15 DIAGNOSIS — K1120 Sialoadenitis, unspecified: Secondary | ICD-10-CM

## 2020-10-06 ENCOUNTER — Other Ambulatory Visit: Payer: Managed Care, Other (non HMO)

## 2020-10-06 ENCOUNTER — Other Ambulatory Visit: Payer: Self-pay

## 2020-10-06 ENCOUNTER — Ambulatory Visit
Admission: RE | Admit: 2020-10-06 | Discharge: 2020-10-06 | Disposition: A | Discharge status: Home / Self Care | Payer: Managed Care, Other (non HMO) | Source: Ambulatory Visit | Attending: Otolaryngology | Admitting: Otolaryngology

## 2020-10-06 DIAGNOSIS — K1120 Sialoadenitis, unspecified: Secondary | ICD-10-CM

## 2020-10-06 MED ORDER — IOPAMIDOL (ISOVUE-300) INJECTION 61%
75.0000 mL | Freq: Once | INTRAVENOUS | Status: AC | PRN
  Administered 2020-10-06: 75 mL via INTRAVENOUS

## 2022-04-23 ENCOUNTER — Other Ambulatory Visit: Payer: Self-pay | Admitting: Internal Medicine

## 2022-04-23 DIAGNOSIS — K50919 Crohn's disease, unspecified, with unspecified complications: Secondary | ICD-10-CM

## 2022-04-27 ENCOUNTER — Ambulatory Visit
Admission: RE | Admit: 2022-04-27 | Discharge: 2022-04-27 | Disposition: A | Discharge status: Home / Self Care | Payer: Managed Care, Other (non HMO) | Source: Ambulatory Visit | Attending: Internal Medicine | Admitting: Internal Medicine

## 2022-04-27 DIAGNOSIS — K50919 Crohn's disease, unspecified, with unspecified complications: Secondary | ICD-10-CM

## 2022-04-27 MED ORDER — IOPAMIDOL (ISOVUE-300) INJECTION 61%
100.0000 mL | Freq: Once | INTRAVENOUS | Status: AC | PRN
  Administered 2022-04-27: 100 mL via INTRAVENOUS
